# Patient Record
Sex: Male | Born: 1960 | Race: White | Hispanic: No | Marital: Single | Smoking: Former smoker
Health system: Southern US, Community
[De-identification: ages and names within clinical notes are randomized; demographics above are authoritative.]

## PROBLEM LIST (undated history)

## (undated) DIAGNOSIS — I10 Essential (primary) hypertension: Secondary | ICD-10-CM

## (undated) DIAGNOSIS — C801 Malignant (primary) neoplasm, unspecified: Secondary | ICD-10-CM

## (undated) DIAGNOSIS — M1990 Unspecified osteoarthritis, unspecified site: Secondary | ICD-10-CM

## (undated) DIAGNOSIS — K219 Gastro-esophageal reflux disease without esophagitis: Secondary | ICD-10-CM

## (undated) DIAGNOSIS — K2270 Barrett's esophagus without dysplasia: Secondary | ICD-10-CM

## (undated) DIAGNOSIS — E119 Type 2 diabetes mellitus without complications: Secondary | ICD-10-CM

## (undated) DIAGNOSIS — Z7985 Long-term (current) use of injectable non-insulin antidiabetic drugs: Secondary | ICD-10-CM

## (undated) DIAGNOSIS — IMO0001 Reserved for inherently not codable concepts without codable children: Secondary | ICD-10-CM

## (undated) DIAGNOSIS — E7800 Pure hypercholesterolemia, unspecified: Secondary | ICD-10-CM

## (undated) HISTORY — DX: Reserved for inherently not codable concepts without codable children: IMO0001

## (undated) HISTORY — DX: Essential (primary) hypertension: I10

## (undated) HISTORY — DX: Gastro-esophageal reflux disease without esophagitis: K21.9

## (undated) HISTORY — DX: Malignant (primary) neoplasm, unspecified: C80.1

---

## 1993-01-10 DIAGNOSIS — C801 Malignant (primary) neoplasm, unspecified: Secondary | ICD-10-CM

## 1993-01-10 HISTORY — DX: Malignant (primary) neoplasm, unspecified: C80.1

## 1993-01-10 HISTORY — PX: OTHER SURGICAL HISTORY: SHX169

## 2007-09-19 ENCOUNTER — Emergency Department: Payer: Self-pay | Admitting: Emergency Medicine

## 2012-06-08 ENCOUNTER — Emergency Department: Payer: Self-pay | Admitting: Emergency Medicine

## 2012-07-31 HISTORY — PX: FOOT SURGERY: SHX648

## 2012-10-11 ENCOUNTER — Encounter: Payer: Self-pay | Admitting: *Deleted

## 2012-10-12 ENCOUNTER — Encounter: Payer: Self-pay | Admitting: Podiatry

## 2012-10-12 ENCOUNTER — Ambulatory Visit (INDEPENDENT_AMBULATORY_CARE_PROVIDER_SITE_OTHER): Payer: BC Managed Care – PPO | Admitting: Podiatry

## 2012-10-12 VITALS — BP 134/75 | HR 60 | Temp 97.7°F | Resp 16 | Ht 72.0 in | Wt 252.8 lb

## 2012-10-12 DIAGNOSIS — M2040 Other hammer toe(s) (acquired), unspecified foot: Secondary | ICD-10-CM

## 2012-10-12 DIAGNOSIS — G5762 Lesion of plantar nerve, left lower limb: Secondary | ICD-10-CM

## 2012-10-12 DIAGNOSIS — G5760 Lesion of plantar nerve, unspecified lower limb: Secondary | ICD-10-CM

## 2012-10-12 NOTE — Patient Instructions (Signed)
Call if any problems

## 2012-10-12 NOTE — Progress Notes (Signed)
Subjective:     Patient ID: Lee Lucas, male   DOB: [birth year], 52 y.o.   MRN: [id number]  HPI I'm doing great. Patient states pain has completely resolved at this time.   Review of Systems     Objective:   Physical Exam neurovascular status within normal limits. Patient's incision sites are healed well with mild edema still noted in the forefoot.     Assessment:     Patient is doing well postoperatively with resolution of symptoms.    Plan:     Advised patient swelling is still normal. Reviewed x-rays with the patient. Discharged unless any problems

## 2013-08-02 ENCOUNTER — Ambulatory Visit: Payer: Self-pay | Admitting: Unknown Physician Specialty

## 2013-08-05 HISTORY — PX: COLONOSCOPY WITH ESOPHAGOGASTRODUODENOSCOPY (EGD): SHX5779

## 2013-08-06 LAB — PATHOLOGY REPORT

## 2014-03-20 ENCOUNTER — Ambulatory Visit: Payer: Self-pay | Admitting: Surgery

## 2015-01-27 ENCOUNTER — Encounter: Payer: Self-pay | Admitting: *Deleted

## 2015-01-28 ENCOUNTER — Ambulatory Visit: Payer: BLUE CROSS/BLUE SHIELD | Admitting: Anesthesiology

## 2015-01-28 ENCOUNTER — Encounter
Admission: RE | Disposition: A | Discharge status: Home / Self Care | Payer: Self-pay | Source: Ambulatory Visit | Attending: Surgery

## 2015-01-28 ENCOUNTER — Ambulatory Visit
Admission: RE | Admit: 2015-01-28 | Discharge: 2015-01-28 | Disposition: A | Discharge status: Home / Self Care | Payer: BLUE CROSS/BLUE SHIELD | Source: Ambulatory Visit | Attending: Surgery | Admitting: Surgery

## 2015-01-28 DIAGNOSIS — Z800 Family history of malignant neoplasm of digestive organs: Secondary | ICD-10-CM | POA: Diagnosis not present

## 2015-01-28 DIAGNOSIS — M19012 Primary osteoarthritis, left shoulder: Secondary | ICD-10-CM | POA: Diagnosis not present

## 2015-01-28 DIAGNOSIS — Z79899 Other long term (current) drug therapy: Secondary | ICD-10-CM | POA: Diagnosis not present

## 2015-01-28 DIAGNOSIS — Z7982 Long term (current) use of aspirin: Secondary | ICD-10-CM | POA: Diagnosis not present

## 2015-01-28 DIAGNOSIS — M7542 Impingement syndrome of left shoulder: Secondary | ICD-10-CM | POA: Diagnosis present

## 2015-01-28 DIAGNOSIS — E119 Type 2 diabetes mellitus without complications: Secondary | ICD-10-CM | POA: Insufficient documentation

## 2015-01-28 DIAGNOSIS — K219 Gastro-esophageal reflux disease without esophagitis: Secondary | ICD-10-CM | POA: Insufficient documentation

## 2015-01-28 DIAGNOSIS — Z7984 Long term (current) use of oral hypoglycemic drugs: Secondary | ICD-10-CM | POA: Insufficient documentation

## 2015-01-28 DIAGNOSIS — Z8547 Personal history of malignant neoplasm of testis: Secondary | ICD-10-CM | POA: Insufficient documentation

## 2015-01-28 DIAGNOSIS — Z87891 Personal history of nicotine dependence: Secondary | ICD-10-CM | POA: Insufficient documentation

## 2015-01-28 DIAGNOSIS — Z8249 Family history of ischemic heart disease and other diseases of the circulatory system: Secondary | ICD-10-CM | POA: Diagnosis not present

## 2015-01-28 DIAGNOSIS — M94212 Chondromalacia, left shoulder: Secondary | ICD-10-CM | POA: Insufficient documentation

## 2015-01-28 DIAGNOSIS — I10 Essential (primary) hypertension: Secondary | ICD-10-CM | POA: Insufficient documentation

## 2015-01-28 DIAGNOSIS — Z86010 Personal history of colonic polyps: Secondary | ICD-10-CM | POA: Insufficient documentation

## 2015-01-28 DIAGNOSIS — M75112 Incomplete rotator cuff tear or rupture of left shoulder, not specified as traumatic: Secondary | ICD-10-CM | POA: Diagnosis not present

## 2015-01-28 HISTORY — DX: Barrett's esophagus without dysplasia: K22.70

## 2015-01-28 HISTORY — DX: Type 2 diabetes mellitus without complications: E11.9

## 2015-01-28 HISTORY — PX: SHOULDER ARTHROSCOPY: SHX128

## 2015-01-28 HISTORY — DX: Gastro-esophageal reflux disease without esophagitis: K21.9

## 2015-01-28 HISTORY — DX: Unspecified osteoarthritis, unspecified site: M19.90

## 2015-01-28 LAB — GLUCOSE, CAPILLARY
GLUCOSE-CAPILLARY: 105 mg/dL — AB (ref 65–99)
GLUCOSE-CAPILLARY: 122 mg/dL — AB (ref 65–99)

## 2015-01-28 SURGERY — ARTHROSCOPY, SHOULDER
Anesthesia: Regional | Laterality: Left | Wound class: Clean

## 2015-01-28 MED ORDER — DEXTROSE 5 % IV SOLN
3.0000 g | Freq: Once | INTRAVENOUS | Status: AC
  Administered 2015-01-28: 3 g via INTRAVENOUS

## 2015-01-28 MED ORDER — LIDOCAINE HCL (CARDIAC) 20 MG/ML IV SOLN
INTRAVENOUS | Status: DC | PRN
  Administered 2015-01-28: 50 mg via INTRATRACHEAL

## 2015-01-28 MED ORDER — LACTATED RINGERS IV SOLN
INTRAVENOUS | Status: DC
  Administered 2015-01-28: 13:00:00 via INTRAVENOUS

## 2015-01-28 MED ORDER — FENTANYL CITRATE (PF) 100 MCG/2ML IJ SOLN: 25.0000 ug | INTRAMUSCULAR | Status: DC | PRN

## 2015-01-28 MED ORDER — ONDANSETRON HCL 4 MG/2ML IJ SOLN: 4.0000 mg | Freq: Four times a day (QID) | INTRAMUSCULAR | Status: DC | PRN

## 2015-01-28 MED ORDER — ONDANSETRON HCL 4 MG/2ML IJ SOLN
INTRAMUSCULAR | Status: DC | PRN
  Administered 2015-01-28: 4 mg via INTRAVENOUS

## 2015-01-28 MED ORDER — FENTANYL CITRATE (PF) 100 MCG/2ML IJ SOLN
INTRAMUSCULAR | Status: DC | PRN
  Administered 2015-01-28: 100 ug via INTRAVENOUS

## 2015-01-28 MED ORDER — PROPOFOL 10 MG/ML IV BOLUS
INTRAVENOUS | Status: DC | PRN
  Administered 2015-01-28: 200 mg via INTRAVENOUS

## 2015-01-28 MED ORDER — OXYCODONE HCL 5 MG PO TABS: 5.0000 mg | ORAL_TABLET | ORAL | Status: DC | PRN

## 2015-01-28 MED ORDER — ONDANSETRON HCL 4 MG PO TABS: 4.0000 mg | ORAL_TABLET | Freq: Four times a day (QID) | ORAL | Status: DC | PRN

## 2015-01-28 MED ORDER — DEXAMETHASONE SODIUM PHOSPHATE 4 MG/ML IJ SOLN
INTRAMUSCULAR | Status: DC | PRN
  Administered 2015-01-28: 8 mg via INTRAVENOUS

## 2015-01-28 MED ORDER — POTASSIUM CHLORIDE IN NACL 20-0.9 MEQ/L-% IV SOLN: INTRAVENOUS | Status: DC

## 2015-01-28 MED ORDER — METOCLOPRAMIDE HCL 5 MG/ML IJ SOLN: 5.0000 mg | Freq: Three times a day (TID) | INTRAMUSCULAR | Status: DC | PRN

## 2015-01-28 MED ORDER — LACTATED RINGERS IV SOLN
INTRAVENOUS | Status: DC | PRN
  Administered 2015-01-28 (×2): 3000 mL

## 2015-01-28 MED ORDER — METOCLOPRAMIDE HCL 5 MG PO TABS: 5.0000 mg | ORAL_TABLET | Freq: Three times a day (TID) | ORAL | Status: DC | PRN

## 2015-01-28 MED ORDER — ONDANSETRON HCL 4 MG/2ML IJ SOLN: 4.0000 mg | Freq: Once | INTRAMUSCULAR | Status: DC | PRN

## 2015-01-28 MED ORDER — MIDAZOLAM HCL 2 MG/2ML IJ SOLN
INTRAMUSCULAR | Status: DC | PRN
Start: 2015-01-28 — End: 2015-01-28
  Administered 2015-01-28: 2 mg via INTRAVENOUS

## 2015-01-28 MED ORDER — ROPIVACAINE HCL 5 MG/ML IJ SOLN
INTRAMUSCULAR | Status: DC | PRN
  Administered 2015-01-28: 35 mL via PERINEURAL

## 2015-01-28 SURGICAL SUPPLY — 35 items
ANCHOR JUGGERKNOT WTAP NDL 2.9 (Anchor) ×2 IMPLANT
BIT DRILL JUGRKNT W/NDL BIT2.9 (DRILL) ×1 IMPLANT
BLADE FULL RADIUS 3.5 (BLADE) ×2 IMPLANT
BUR ACROMIONIZER 4.0 (BURR) ×2 IMPLANT
CHLORAPREP W/TINT 26ML (MISCELLANEOUS) ×4 IMPLANT
COVER LIGHT HANDLE UNIVERSAL (MISCELLANEOUS) ×4 IMPLANT
COVER MAYO STAND STRL (DRAPES) ×2 IMPLANT
DRAPE IMP U-DRAPE 54X76 (DRAPES) ×4 IMPLANT
DRILL JUGGERKNOT W/NDL BIT 2.9 (DRILL) ×2
GAUZE PETRO XEROFOAM 1X8 (MISCELLANEOUS) ×2 IMPLANT
GAUZE SPONGE 4X4 12PLY STRL (GAUZE/BANDAGES/DRESSINGS) ×2 IMPLANT
GLOVE BIO SURGEON STRL SZ8 (GLOVE) ×4 IMPLANT
GLOVE INDICATOR 8.0 STRL GRN (GLOVE) ×2 IMPLANT
GOWN STRL REUS W/ TWL LRG LVL3 (GOWN DISPOSABLE) ×1 IMPLANT
GOWN STRL REUS W/ TWL XL LVL3 (GOWN DISPOSABLE) ×1 IMPLANT
GOWN STRL REUS W/TWL LRG LVL3 (GOWN DISPOSABLE) ×1
GOWN STRL REUS W/TWL XL LVL3 (GOWN DISPOSABLE) ×1
IV LACTATED RINGER IRRG 3000ML (IV SOLUTION) ×2
IV LR IRRIG 3000ML ARTHROMATIC (IV SOLUTION) ×2 IMPLANT
KIT CANNULA 8X76-LX IN CANNULA (CANNULA) ×2 IMPLANT
KIT ROOM TURNOVER OR (KITS) ×2 IMPLANT
MANIFOLD 4PT FOR NEPTUNE1 (MISCELLANEOUS) ×2 IMPLANT
MAT BLUE FLOOR 46X72 FLO (MISCELLANEOUS) ×2 IMPLANT
NEEDLE HYPO 21X1.5 SAFETY (NEEDLE) ×2 IMPLANT
PACK ARTHROSCOPY SHOULDER (MISCELLANEOUS) ×2 IMPLANT
PAD GROUND ADULT SPLIT (MISCELLANEOUS) ×2 IMPLANT
SLING ULTRA II LG (MISCELLANEOUS) ×2 IMPLANT
STAPLER SKIN PROX 35W (STAPLE) ×2 IMPLANT
STRAP BODY AND KNEE 60X3 (MISCELLANEOUS) ×4 IMPLANT
SUT ETHIBOND 0 MO6 C/R (SUTURE) ×2 IMPLANT
SUT VIC AB 2-0 CT1 27 (SUTURE) ×1
SUT VIC AB 2-0 CT1 TAPERPNT 27 (SUTURE) ×1 IMPLANT
TAPE MICROFOAM 4IN (TAPE) ×2 IMPLANT
TUBING ARTHRO INFLOW-ONLY STRL (TUBING) ×2 IMPLANT
WAND HAND CNTRL MULTIVAC 90 (MISCELLANEOUS) ×2 IMPLANT

## 2015-01-28 NOTE — Anesthesia Postprocedure Evaluation (Signed)
Anesthesia Post Note  Patient: Lee Lucas  Procedure(s) Performed: Procedure(s) (LRB): ARTHROSCOPY SHOULDER WITH DEBRIDEMENT DECOMPRESSION AND MANIPULATION UNDER ANESTHESIA (Left)  Patient location during evaluation: PACU Anesthesia Type: Regional and General Level of consciousness: awake and alert and oriented Pain management: satisfactory to patient Vital Signs Assessment: post-procedure vital signs reviewed and stable Respiratory status: spontaneous breathing, nonlabored ventilation and respiratory function stable Cardiovascular status: blood pressure returned to baseline and stable Postop Assessment: Adequate PO intake and No signs of nausea or vomiting Anesthetic complications: no    Raliegh Ip

## 2015-01-28 NOTE — Discharge Instructions (Signed)
General Anesthesia, Adult, Care After Refer to this sheet in the next few weeks. These instructions provide you with information on caring for yourself after your procedure. Your health care provider may also give you more specific instructions. Your treatment has been planned according to current medical practices, but problems sometimes occur. Call your health care provider if you have any problems or questions after your procedure. WHAT TO EXPECT AFTER THE PROCEDURE After the procedure, it is typical to experience:  Sleepiness.  Nausea and vomiting. HOME CARE INSTRUCTIONS  For the first 24 hours after general anesthesia:  Have a responsible person with you.  Do not drive a car. If you are alone, do not take public transportation.  Do not drink alcohol.  Do not take medicine that has not been prescribed by your health care provider.  Do not sign important papers or make important decisions.  You may resume a normal diet and activities as directed by your health care provider.  Change bandages (dressings) as directed.  If you have questions or problems that seem related to general anesthesia, call the hospital and ask for the anesthetist or anesthesiologist on call. SEEK MEDICAL CARE IF:  You have nausea and vomiting that continue the day after anesthesia.  You develop a rash. SEEK IMMEDIATE MEDICAL CARE IF:   You have difficulty breathing.  You have chest pain.  You have any allergic problems.   This information is not intended to replace advice given to you by your health care provider. Make sure you discuss any questions you have with your health care provider.   Document Released: 04/04/2000 Document Revised: 01/17/2014 Document Reviewed: 04/27/2011 Elsevier Interactive Patient Education 2016 [employer].  Keep dressing dry and intact.  May shower after dressing changed on post-op day #4 (Sunday).  Cover staples with Band-Aids after drying off. Apply ice  frequently to shoulder. Keep shoulder immobilizer on at all times for first 3-5 days except may remove for bathing purposes. May wean out of sling after 3-5 days as symptoms permit. Follow-up in 10-14 days or as scheduled.

## 2015-01-28 NOTE — Transfer of Care (Signed)
Immediate Anesthesia Transfer of Care Note  Patient: Lee Lucas  Procedure(s) Performed: Procedure(s) with comments: ARTHROSCOPY SHOULDER WITH DEBRIDEMENT DECOMPRESSION AND MANIPULATION UNDER ANESTHESIA (Left) - Diabetic - oral meds  Patient Location: PACU  Anesthesia Type: General LMA, Regional  Level of Consciousness: awake, alert  and patient cooperative  Airway and Oxygen Therapy: Patient Spontanous Breathing and Patient connected to supplemental oxygen  Post-op Assessment: Post-op Vital signs reviewed, Patient's Cardiovascular Status Stable, Respiratory Function Stable, Patent Airway and No signs of Nausea or vomiting  Post-op Vital Signs: Reviewed and stable  Complications: No apparent anesthesia complications

## 2015-01-28 NOTE — Anesthesia Procedure Notes (Addendum)
Anesthesia Regional Block:  Interscalene brachial plexus block  Pre-Anesthetic Checklist: ,, timeout performed, Correct Patient, Correct Site, Correct Laterality, Correct Procedure, Correct Position, site marked, Risks and benefits discussed,  Surgical consent,  Pre-op evaluation,  At surgeon's request and post-op pain management  Laterality: Left  Prep: chloraprep       Needles:  Injection technique: Single-shot  Needle Type: Stimiplex     Needle Length: 10cm 10 cm Needle Gauge: 21 and 21 G    Additional Needles:  Procedures: ultrasound guided (picture in chart) Interscalene brachial plexus block Narrative:  Start time: 01/28/2015 12:02 PM End time: 01/28/2015 12:07 PM Injection made incrementally with aspirations every 5 mL.  Performed by: Personally  Anesthesiologist: Ronelle Nigh  Additional Notes: Functioning IV was confirmed and monitors applied. Ultrasound guidance: relevant anatomy identified, needle position confirmed, local anesthetic spread visualized around nerve(s)., vascular puncture avoided.  Image printed for medical record.  Negative aspiration and no paresthesias; incremental administration of local anesthetic. The patient tolerated the procedure well. Vitals signes recorded in RN notes.   Procedure Name: LMA Insertion Performed by: Londell Moh Pre-anesthesia Checklist: Patient identified, Emergency Drugs available, Suction available, Timeout performed and Patient being monitored Patient Re-evaluated:Patient Re-evaluated prior to inductionOxygen Delivery Method: Circle system utilized Preoxygenation: Pre-oxygenation with 100% oxygen Intubation Type: IV induction LMA: LMA inserted LMA Size: 5.0 Number of attempts: 1 Placement Confirmation: positive ETCO2 and breath sounds checked- equal and bilateral Tube secured with: Tape

## 2015-01-28 NOTE — H&P (Signed)
Paper H&P to be scanned into permanent record. H&P reviewed. No changes. 

## 2015-01-28 NOTE — Progress Notes (Signed)
Assisted Clance Boll ANMD with left, interscalene  block. Side rails up, monitors on throughout procedure. See vital signs in flow sheet. Tolerated Procedure well.

## 2015-01-28 NOTE — Op Note (Signed)
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  Postoperative diagnosis: Impingement/tendinopathy with partial-thickness rotator cuff tear, biceps tendinopathy, and significant degenerative joint disease, left shoulder.  Procedure: Extensive arthroscopic debridement, arthroscopic subacromial decompression, and mini-open biceps tenodesis, left shoulder.  Anesthesia: General laryngeal mask anesthesia with interscalene block placed preoperatively by the anesthesiologist.  Surgeon:   Pascal Lux, MD  Assistant:   None  Findings: As above. There were extensive grade 3-4 chondromalacial changes involving both the glenoid and humeral head surfaces. There was extensive labral fraying with a substantial type I tear of the superior and postero-superior portions of the labrum without frank detachment of the labrum superiorly. There was a partial thickness longitudinal tear involving the superior insertional fibers of the subscapularis tendon, as well as an articular surface partial thickness tear of the insertional fibers of the supraspinatus tendon involving proximally 25-30% of the footprint.  Complications: None  Fluids:   700 cc  Estimated blood loss: 15 cc  Tourniquet time: None  Drains: None  Closure: Staples   Brief clinical note: The patient is a 55 year old male with a long history of gradually worsening left shoulder pain. The patient's symptoms have progressed despite medications, activity modification, etc. The patient's history and examination are consistent with impingement/tendinopathy with possible adhesive capsulitis. An MRI scan demonstrated tendinopathic changes, as well as some degenerative changes but no obvious rotator cuff tear. The patient presents at this time for definitive management of these shoulder symptoms.  Procedure: The patient  underwent placement of an interscalene block by the anesthesiologis in the preoperative holding area before he was brought back to the operating room and lain in the supine position t. The patient then underwent general laryngeal mask anesthesia before being repositioned in the beach chair position using the beach chair positioner. The left shoulder was placed through a range of motion. Passively, the patient exhibited full range of motion without any adhesions. The left shoulder and upper extremity were prepped with ChloraPrep solution before being draped sterilely. Preoperative antibiotics were administered. A timeout was performed to confirm the proper surgical site before the expected portal sites and incision site were injected with 0.5% Sensorcaine with epinephrine. A posterior portal was created and the glenohumeral joint thoroughly inspected with the findings as described above. An anterior portal was created using an outside-in technique. The labrum and rotator cuff were further probed, again confirming the above-noted findings. Extensive debridement was performed, removing significant fraying of the superior posterior labral regions with portions of the labrum extending well down into the glenohumeral joint. The areas of degenerative fraying of the articular insertional fibers of the supraspinatus and subscapularis tendons also were debrided thoroughly. Loose articular cartilage also was debrided back to stable margins using the full-radius resector. Finally, there were substantial areas of significant reactive synovitis anteriorly, superiorly, and posteriorly which were debrided with the full-radius resector. The ArthroCare wand was used to release the biceps tendon from its labral insertion. It also was used to obtain hemostasis as well as to "anneal" the labrum superiorly and anteriorly. The instruments were removed from the joint after suctioning the excess fluid.  The camera was repositioned through  the posterior portal into the subacromial space. A separate lateral portal was created using an outside-in technique. The 3.5 mm full-radius resector was introduced and used to perform a subtotal bursectomy. The ArthroCare wand was then inserted and used to remove the periosteal tissue  off the undersurface of the anterior third of the acromion as well as to recess the coracoacromial ligament from its attachment along the anterior and lateral margins of the acromion. The 4.0 mm acromionizing bur was introduced and used to complete the decompression by removing the undersurface of the anterior third of the acromion. The full radius resector was reintroduced to remove any residual bony debris before the ArthroCare wand was reintroduced to obtain hemostasis. The instruments were then removed from the subacromial space after suctioning the excess fluid.  An approximately 4-5 cm incision was made over the anterolateral aspect of the shoulder beginning at the anterolateral corner of the acromion and extending distally in line with the bicipital groove. This incision was carried down through the subcutaneous tissues to expose the deltoid fascia. The raphae between the anterior and middle thirds was identified and this plane developed to provide access into the subacromial space. Additional bursal tissues were debrided sharply using Metzenbaum scissors. The rotator cuff tear was readily identified and carefully inspected. There is no evidence for any bursal surface tearing of the rotator cuff. The bicipital groove was identified by palpation and opened for 1-1.5 cm. The biceps tendon stump was retrieved through this defect. The floor of the bicipital groove was roughened with a curet before another Biomet 2.9 mm JuggerKnot anchor was inserted. Both sets of sutures were passed through the biceps tendon and tied securely to effect the tenodesis. The bicipital sheath was reapproximated using two #0 Ethibond interrupted  sutures, incorporating the biceps tendon to further reinforce the tenodesis.  The wound was copiously irrigated with sterile saline solution before the deltoid raphae was reapproximated using 2-0 Vicryl interrupted sutures. The subcutaneous tissues were closed in two layers using 2-0 Vicryl interrupted sutures before the skin was closed using staples. The portal sites also were closed using staples. A sterile bulky dressing was applied to the shoulder before the arm was placed into a shoulder immobilizer. The patient was then awakened, extubated, and returned to the recovery room in satisfactory condition after tolerating the procedure well.

## 2015-01-28 NOTE — Anesthesia Preprocedure Evaluation (Signed)
Anesthesia Evaluation  Patient identified by MRN, date of birth, ID band  Reviewed: Allergy & Precautions, H&P , NPO status , Patient's Chart, lab work & pertinent test results  Airway Mallampati: II  TM Distance: >3 FB Neck ROM: full    Dental no notable dental hx.    Pulmonary former smoker,    Pulmonary exam normal        Cardiovascular hypertension,  Rhythm:regular Rate:Normal     Neuro/Psych    GI/Hepatic GERD  ,  Endo/Other  diabetes  Renal/GU      Musculoskeletal   Abdominal   Peds  Hematology   Anesthesia Other Findings   Reproductive/Obstetrics                             Anesthesia Physical Anesthesia Plan  ASA: II  Anesthesia Plan: General LMA and Regional   Post-op Pain Management: GA combined w/ Regional for post-op pain   Induction:   Airway Management Planned:   Additional Equipment:   Intra-op Plan:   Post-operative Plan:   Informed Consent: I have reviewed the patients History and Physical, chart, labs and discussed the procedure including the risks, benefits and alternatives for the proposed anesthesia with the patient or authorized representative who has indicated his/her understanding and acceptance.     Plan Discussed with: CRNA  Anesthesia Plan Comments:         Anesthesia Quick Evaluation

## 2015-01-29 ENCOUNTER — Encounter: Payer: Self-pay | Admitting: Surgery

## 2015-07-28 ENCOUNTER — Encounter: Payer: BLUE CROSS/BLUE SHIELD | Attending: Internal Medicine | Admitting: Dietician

## 2015-07-28 ENCOUNTER — Encounter: Payer: Self-pay | Admitting: Dietician

## 2015-07-28 VITALS — BP 124/78 | Ht 72.0 in | Wt 253.9 lb

## 2015-07-28 DIAGNOSIS — E119 Type 2 diabetes mellitus without complications: Secondary | ICD-10-CM | POA: Diagnosis present

## 2015-07-28 NOTE — Progress Notes (Signed)
Diabetes Self-Management Education  Visit Type: First/Initial  Appt. Start Time: 0900 Appt. End Time: 1000  07/28/2015  [name], identified by name and date of birth, is a 55 y.o. male with a diagnosis of Diabetes: Type 2.   ASSESSMENT  Blood pressure 124/78, height 6' (1.829 m), weight 253 lb 14.4 oz (115.168 kg). Body mass index is 34.43 kg/(m^2).  Lacks knowledge of diabetes care      Diabetes Self-Management Education - 07/28/15 1023    Visit Information   Visit Type First/Initial   Initial Visit   Diabetes Type Type 2   Health Coping   How would you rate your overall health? Fair   Psychosocial Assessment   Patient Belief/Attitude about Diabetes Motivated to manage diabetes   Self-care barriers Other (comment)  works irregular days each week   Other persons present Patient   Patient Concerns Glycemic Control;Weight Control   Special Needs None   Preferred Learning Style Auditory   Learning Readiness Ready   Pre-Education Assessment   Patient understands the diabetes disease and treatment process. Needs Instruction   Patient understands incorporating nutritional management into lifestyle. Needs Instruction   Patient undertands incorporating physical activity into lifestyle. Needs Instruction   Patient understands using medications safely. Needs Instruction   Patient understands monitoring blood glucose, interpreting and using results Needs Instruction   Patient understands prevention, detection, and treatment of acute complications. Needs Instruction   Patient understands prevention, detection, and treatment of chronic complications. Needs Instruction   Patient understands how to develop strategies to address psychosocial issues. Needs Instruction   Patient understands how to develop strategies to promote health/change behavior. Needs Instruction   Complications   Last HgB A1C per patient/outside source 7.8 %  06-16-15   How often do you check your blood  sugar? --  FBG 3x/wk.-pt using his mom's BG meter   Fasting Blood glucose range (mg/dL) 130-179;180-200   Have you had a dilated eye exam in the past 12 months? Yes  10-2014   Have you had a dental exam in the past 12 months? Yes  1 month ago   Are you checking your feet? No   Dietary Intake   Breakfast --  eats 3 meals/day and 3 snacks-eats breakfast at 5:30-6am   Snack (morning) --  eats crackers, pnuts, or snack foods/sweets at [place] --  eats lunch at 12p-usually sandwich   Snack (afternoon) --  eats crackers, pnuts or sweets/snack foods at 2p   Dinner --  eats supper at 4p-eats fried foods daily and sweets 5-6x/wk.   Snack (evening) --  eats crackers, pnuts or brownies    Beverage(s) --  drinks water 6-7x/day-regular soda, sweet and gatorade 6-7x/day-1 diet soda/day-0-1 fruit juice/day and milk 1-2x/day   Exercise   Exercise Type ADL's   Patient Education   Previous Diabetes Education No   Disease state  Definition of diabetes, type 1 and 2, and the diagnosis of diabetes;Explored patient's options for treatment of their diabetes;Factors that contribute to the development of diabetes   Nutrition management  Role of diet in the treatment of diabetes and the relationship between the three main macronutrients and blood glucose level;Food label reading, portion sizes and measuring food.;Carbohydrate counting   Physical activity and exercise  Role of exercise on diabetes management, blood pressure control and cardiac health.;Helped patient identify appropriate exercises in relation to his/her diabetes, diabetes complications and other health issue.   Medications Reviewed patients medication for diabetes, action, purpose,  timing of dose and side effects.   Monitoring Taught/evaluated SMBG meter.;Purpose and frequency of SMBG.;Taught/discussed recording of test results and interpretation of SMBG.;Identified appropriate SMBG and/or A1C goals.  gave pt Contour Next One meter and  instructed on its use-BG 155 2 hr pp   Chronic complications Relationship between chronic complications and blood glucose control;Dental care;Retinopathy and reason for yearly dilated eye exams   Personal strategies to promote health Lifestyle issues that need to be addressed for better diabetes care;Helped patient develop diabetes management plan for (enter comment)   Outcomes   Expected Outcomes Demonstrated interest in learning. Expect positive outcomes      Individualized Plan for Diabetes Self-Management Training:   Learning Objective:  Patient will have a greater understanding of diabetes self-management. Patient education plan is to attend individual and/or group sessions per assessed needs and concerns.   Plan:   Check BG's 2x/day FBG and 2 hr pp BG and record  Walk 10-15 min 3x/wk.  Avoid sweetened beverages and fruit juices  Eat 3 meals/day and 2 snacks in am and at bedtime  Eat 3-4 carbohydrate serving/s/meal + protein  Include 1 carbohydrate serving/snack + protein  Space meals 4-6 hours apart  Limit intake of fried foods and sweets  Make healthy food choices  Call MD for prescription for Contour Next test strips and Microlet Lancets-check BG's 2x/day  Get a sharps container  Bring BG log to next appointment   Expected Outcomes:  Demonstrated interest in learning. Expect positive outcomes  Education material provided: General Meal Planning Guidelines, Contour Next One meter  If problems or questions, patient to contact team via:  [phone]  Future DSME appointment:  08-13-15 class 1 (will need to schedule class 2 and class 3 later due to work schedule)

## 2015-07-28 NOTE — Patient Instructions (Signed)
  Check blood sugars 2 x day before breakfast and 2 hrs after supper every day Exercise:  walk for  10-15  minutes  3  days a week Avoid sugar sweetened drinks (soda, tea, coffee, sports drinks, juices) Limit intake of fried foods and sweets/desserts Eat 3 meals day,  2  snacks a day in am and at bedtime Eat 3-4 carbohydrate servings/meal + protein Eat 1 carbohydrate serving/snack + protein Make healthy food choices Space meals 4-6 hours apart Bring blood sugar records to the next appointment/class Call your doctor for a prescription for:  1. Meter strips (type)  Contour Next strips  checking2 times per day  2. Lancets (type) Microlet lancets  checking 2   times per day Get a Sharps container Return for appointment/classes on: 08-13-15 for class 1  (need to schedule classes 2  and 3 later due to his work)

## 2015-08-13 ENCOUNTER — Ambulatory Visit: Payer: BLUE CROSS/BLUE SHIELD

## 2015-08-13 ENCOUNTER — Telehealth: Payer: Self-pay | Admitting: Dietician

## 2015-08-14 NOTE — Telephone Encounter (Signed)
Patient called and cancelled Class 1 of the Diabetes Program. I called and spoke with his mother who stated he can not reschedule at this time due to his work schedule.

## 2015-08-24 ENCOUNTER — Encounter: Payer: Self-pay | Admitting: Dietician

## 2017-02-06 DIAGNOSIS — E7800 Pure hypercholesterolemia, unspecified: Secondary | ICD-10-CM | POA: Insufficient documentation

## 2018-08-10 ENCOUNTER — Ambulatory Visit
Admission: RE | Admit: 2018-08-10 | Discharge: 2018-08-10 | Disposition: A | Discharge status: Home / Self Care | Payer: Disability Insurance | Source: Ambulatory Visit | Attending: Internal Medicine | Admitting: Internal Medicine

## 2018-08-10 ENCOUNTER — Other Ambulatory Visit: Payer: Self-pay | Admitting: Internal Medicine

## 2018-08-10 DIAGNOSIS — M25512 Pain in left shoulder: Secondary | ICD-10-CM | POA: Insufficient documentation

## 2018-08-10 DIAGNOSIS — M25511 Pain in right shoulder: Secondary | ICD-10-CM

## 2019-09-12 ENCOUNTER — Other Ambulatory Visit: Payer: Self-pay

## 2019-09-12 ENCOUNTER — Ambulatory Visit: Payer: BC Managed Care – PPO | Admitting: Cardiology

## 2019-09-12 ENCOUNTER — Encounter: Payer: Self-pay | Admitting: Cardiology

## 2019-09-12 VITALS — BP 116/68 | HR 61 | Ht 71.0 in | Wt 251.2 lb

## 2019-09-12 DIAGNOSIS — E7800 Pure hypercholesterolemia, unspecified: Secondary | ICD-10-CM | POA: Diagnosis not present

## 2019-09-12 DIAGNOSIS — I10 Essential (primary) hypertension: Secondary | ICD-10-CM

## 2019-09-12 DIAGNOSIS — R011 Cardiac murmur, unspecified: Secondary | ICD-10-CM | POA: Diagnosis not present

## 2019-09-12 NOTE — Patient Instructions (Signed)

## 2019-09-12 NOTE — Progress Notes (Signed)
Cardiology Office Note:    Date:  09/12/2019   ID:  Lee Lucas, DOB 08/17/1960, MRN [id number]  PCP:  Bunnie Pion, [facility] Cardiologist:  Kate Sable, MD  [place] Electrophysiologist:  None   Referring MD: Bunnie Pion, FNP   Chief Complaint  Patient presents with  . other    Referred by [facility] for new onset murmur   Lee Lucas is a 59 y.o. male who is being seen today for the evaluation of cardiac murmur at the request of Bunnie Pion, FNP.   History of Present Illness:    Lee Lucas is a 59 y.o. male with a hx of hypertension, diabetes, hyperlipidemia, prior smoker x20 years who presents due to cardiac murmur.  Patient saw her primary care provider for regular visit where cardiac murmur was noted on exam.  He denies any history of heart disease.  He takes Lasix for lower extremity edema.  His edema usually occurs when he has been on his feet for too long, towards the end of the day. denies chest pain or shortness of breath at rest or with exertion.  He otherwise feels well from a cardiac perspective.  Has left shoulder arthritis, has some stiffness secondary to recent surgery.  Past Medical History:  Diagnosis Date  . Arthritis    left shoulder, left knee  . Barrett's esophagus   . 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 Diabetes mellitus without complication ([place])   . GERD (gastroesophageal reflux disease)   . HBP (high blood pressure)   . Reflux     Past Surgical History:  Procedure Laterality Date  . COLONOSCOPY WITH ESOPHAGOGASTRODUODENOSCOPY (EGD)  08/05/13  . FOOT SURGERY Left 7.22.14  . SHOULDER ARTHROSCOPY Left 01/28/2015   Procedure: ARTHROSCOPY SHOULDER WITH DEBRIDEMENT DECOMPRESSION AND MANIPULATION UNDER ANESTHESIA;  Surgeon: Corky Mull, MD;  Location: [place];  Service: Orthopedics;  Laterality: Left;  Diabetic - oral meds  . 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 aspirin 81 MG tablet Take 81 mg by mouth daily.  Marland Kitchen atorvastatin (LIPITOR) 40 MG tablet Take 40 mg by mouth at bedtime.  . furosemide (LASIX) 40 MG tablet Take 40 mg by mouth daily. 1-2 daily  . lisinopril (ZESTRIL) 10 MG tablet Take 10 mg by mouth daily.  . metFORMIN (GLUCOPHAGE) 1000 MG tablet Take 1,000 mg by mouth 2 (two) times daily with a meal.  . pioglitazone (ACTOS) 30 MG tablet Take 30 mg by mouth daily.   . Vitamin D, Ergocalciferol, (DRISDOL) 1.25 MG (50000 UNIT) CAPS capsule Take 50,000 Units by mouth once a week.     Allergies:   Patient has no known allergies.   Social History   Socioeconomic History  . Marital status: Single    Spouse name: Not on file  . Number of children: Not on file  . Years of education: Not on file  . Highest education level: Not on file  Occupational History  . Not on file  Tobacco Use  . Smoking status: Former Smoker    Packs/day: 1.50    Years: 18.00    Pack years: 27.00    Types: Cigars, Cigarettes    Quit date: 02/12/2000    Years since quitting: 19.5  . Smokeless tobacco: Never Used  Substance and Sexual Activity  . Alcohol use: Yes  Alcohol/week: 0.0 - 1.0 standard drinks    Comment: DRINK 2-3 BEERS EACH WEEK  . Drug use: No  . Sexual activity: Not on file  Other Topics Concern  . Not on file  Social History Narrative  . Not on file   Social Determinants of Health   Financial Resource Strain:   . Difficulty of Paying Living Expenses: Not on file  Food Insecurity:   . Worried About Charity fundraiser in the Last Year: Not on file  . Ran Out of Food in the Last Year: Not on file  Transportation Needs:   . Lack of Transportation (Medical): Not on file  . Lack of Transportation (Non-Medical): Not on file  Physical Activity:   . Days of Exercise per Week: Not on file  . Minutes of Exercise per Session: Not on file  Stress:   . Feeling of Stress : Not on file   Social Connections:   . Frequency of Communication with Friends and Family: Not on file  . Frequency of Social Gatherings with Friends and Family: Not on file  . Attends Religious Services: Not on file  . Active Member of Clubs or Organizations: Not on file  . Attends Archivist Meetings: Not on file  . Marital Status: Not on file     Family History: The patient's family history includes Heart murmur in his mother.  ROS:   Please see the history of present illness.     All other systems reviewed and are negative.  EKGs/Labs/Other Studies Reviewed:    The following studies were reviewed today:   EKG:  EKG is  ordered today.  The ekg ordered today demonstrates normal sinus rhythm, normal ECG.  Recent Labs: No results found for requested labs within last 8760 hours.  Recent Lipid Panel No results found for: CHOL, TRIG, HDL, CHOLHDL, VLDL, LDLCALC, LDLDIRECT  Physical Exam:    VS:  BP 116/68   Pulse 61   Ht 5\' 11"  (1.803 m)   Wt 251 lb 3.2 oz (113.9 kg)   SpO2 97%   BMI 35.04 kg/m     Wt Readings from Last 3 Encounters:  09/12/19 251 lb 3.2 oz (113.9 kg)  07/28/15 253 lb 14.4 oz (115.2 kg)  01/28/15 264 lb (119.7 kg)     GEN:  Well nourished, well developed in no acute distress HEENT: Normal NECK: No JVD; No carotid bruits LYMPHATICS: No lymphadenopathy CARDIAC: RRR, 1/6 systolic murmur right upper sternal border. RESPIRATORY:  Clear to auscultation without rales, wheezing or rhonchi  ABDOMEN: Soft, non-tender, distended MUSCULOSKELETAL:  trace edema; No deformity  SKIN: Warm and dry NEUROLOGIC:  Alert and oriented x 3 PSYCHIATRIC:  Normal affect   ASSESSMENT:    1. Systolic murmur   2. Essential hypertension   3. Pure hypercholesterolemia    PLAN:    In order of problems listed above:  1. Patient with systolic murmur noted on cardiac exam.  Denies chest pain or shortness of breath.  Trace edema noted on exam.  Get echocardiogram to evaluate  any significant valvular pathology or structural cardiac defects. 2. History of hypertension, BP controlled.  Continue lisinopril. 3. History of hyperlipidemia, on statin.  Follow-up after echocardiogram.   Medication Adjustments/Labs and Tests Ordered: Current medicines are reviewed at length with the patient today.  Concerns regarding medicines are outlined above.  Orders Placed This Encounter  Procedures  . EKG 12-Lead  . ECHOCARDIOGRAM COMPLETE   No orders of the defined types  were placed in this encounter.   Patient Instructions  Medication Instructions:  Your physician recommends that you continue on your current medications as directed. Please refer to the Current Medication list given to you today.  *If you need a refill on your cardiac medications before your next appointment, please call your pharmacy*   Lab Work: None Ordered If you have labs (blood work) drawn today and your tests are completely normal, you will receive your results only by: Marland Kitchen MyChart Message (if you have MyChart) OR . A paper copy in the mail If you have any lab test that is abnormal or we need to change your treatment, we will call you to review the results.   Testing/Procedures:  1. Your physician has requested that you have an echocardiogram. Echocardiography is a painless test that uses sound waves to create images of your heart. It provides your doctor with information about the size and shape of your heart and how well your heart's chambers and valves are working. This procedure takes approximately one hour. There are no restrictions for this procedure.    Follow-Up: At [facility], you and your health needs are our priority.  As part of our continuing mission to provide you with exceptional heart care, we have created designated Provider Care Teams.  These Care Teams include your primary Cardiologist (physician) and Advanced Practice Providers (APPs -  Physician Assistants and Nurse  Practitioners) who all work together to provide you with the care you need, when you need it.  We recommend signing up for the patient portal called "MyChart".  Sign up information is provided on this After Visit Summary.  MyChart is used to connect with patients for Virtual Visits (Telemedicine).  Patients are able to view lab/test results, encounter notes, upcoming appointments, etc.  Non-urgent messages can be sent to your provider as well.   To learn more about what you can do with MyChart, go to [email or web address].    Your next appointment:   Follow up after Echo   The format for your next appointment:   In Person  Provider:   Kate Sable, MD   Other Instructions      Signed, Kate Sable, MD  09/12/2019 12:42 PM    [place]

## 2019-10-11 ENCOUNTER — Ambulatory Visit (INDEPENDENT_AMBULATORY_CARE_PROVIDER_SITE_OTHER): Payer: BC Managed Care – PPO

## 2019-10-11 ENCOUNTER — Other Ambulatory Visit: Payer: Self-pay

## 2019-10-11 DIAGNOSIS — R011 Cardiac murmur, unspecified: Secondary | ICD-10-CM

## 2019-10-11 LAB — ECHOCARDIOGRAM COMPLETE
AR max vel: 3.12 cm2
AV Area VTI: 3.55 cm2
AV Area mean vel: 3.55 cm2
AV Mean grad: 6 mmHg
AV Peak grad: 12.4 mmHg
Ao pk vel: 1.76 m/s
Area-P 1/2: 4.44 cm2
Calc EF: 53.9 %
S' Lateral: 3.9 cm
Single Plane A2C EF: 55.9 %
Single Plane A4C EF: 50.6 %

## 2019-10-18 ENCOUNTER — Ambulatory Visit: Payer: Disability Insurance | Admitting: Cardiology

## 2020-04-21 ENCOUNTER — Other Ambulatory Visit: Payer: Self-pay | Admitting: Orthopedic Surgery

## 2020-04-21 ENCOUNTER — Ambulatory Visit
Admission: RE | Admit: 2020-04-21 | Discharge: 2020-04-21 | Disposition: A | Discharge status: Home / Self Care | Payer: Disability Insurance | Source: Ambulatory Visit | Attending: Orthopedic Surgery | Admitting: Orthopedic Surgery

## 2020-04-21 ENCOUNTER — Telehealth: Payer: Self-pay | Admitting: Orthopedic Surgery

## 2020-04-21 DIAGNOSIS — S4292XA Fracture of left shoulder girdle, part unspecified, initial encounter for closed fracture: Secondary | ICD-10-CM

## 2020-07-17 ENCOUNTER — Other Ambulatory Visit: Payer: Self-pay | Admitting: Student

## 2020-07-17 ENCOUNTER — Other Ambulatory Visit (HOSPITAL_COMMUNITY): Payer: Self-pay | Admitting: Student

## 2020-07-17 DIAGNOSIS — M19012 Primary osteoarthritis, left shoulder: Secondary | ICD-10-CM

## 2020-07-17 DIAGNOSIS — M7582 Other shoulder lesions, left shoulder: Secondary | ICD-10-CM

## 2020-07-29 ENCOUNTER — Ambulatory Visit
Admission: RE | Admit: 2020-07-29 | Discharge: 2020-07-29 | Disposition: A | Discharge status: Home / Self Care | Payer: BC Managed Care – PPO | Source: Ambulatory Visit | Attending: Student | Admitting: Student

## 2020-07-29 ENCOUNTER — Other Ambulatory Visit: Payer: Self-pay

## 2020-07-29 DIAGNOSIS — M19012 Primary osteoarthritis, left shoulder: Secondary | ICD-10-CM | POA: Diagnosis present

## 2020-07-29 DIAGNOSIS — M7582 Other shoulder lesions, left shoulder: Secondary | ICD-10-CM | POA: Diagnosis not present

## 2020-08-14 ENCOUNTER — Other Ambulatory Visit: Payer: Self-pay | Admitting: Surgery

## 2020-08-19 ENCOUNTER — Inpatient Hospital Stay: Admission: RE | Admit: 2020-08-19 | Payer: Disability Insurance | Source: Ambulatory Visit

## 2020-08-19 ENCOUNTER — Other Ambulatory Visit: Payer: Self-pay

## 2020-08-19 ENCOUNTER — Encounter
Admission: RE | Admit: 2020-08-19 | Discharge: 2020-08-19 | Disposition: A | Discharge status: Home / Self Care | Payer: Disability Insurance | Source: Ambulatory Visit | Attending: Surgery | Admitting: Surgery

## 2020-08-19 DIAGNOSIS — Z01818 Encounter for other preprocedural examination: Secondary | ICD-10-CM | POA: Diagnosis present

## 2020-08-19 LAB — URINALYSIS, ROUTINE W REFLEX MICROSCOPIC
Bilirubin Urine: NEGATIVE
Glucose, UA: NEGATIVE mg/dL
Hgb urine dipstick: NEGATIVE
Ketones, ur: NEGATIVE mg/dL
Leukocytes,Ua: NEGATIVE
Nitrite: NEGATIVE
Protein, ur: NEGATIVE mg/dL
Specific Gravity, Urine: 1.008 (ref 1.005–1.030)
pH: 6 (ref 5.0–8.0)

## 2020-08-19 LAB — COMPREHENSIVE METABOLIC PANEL
ALT: 19 U/L (ref 0–44)
AST: 19 U/L (ref 15–41)
Albumin: 3.4 g/dL — ABNORMAL LOW (ref 3.5–5.0)
Alkaline Phosphatase: 73 U/L (ref 38–126)
Anion gap: 8 (ref 5–15)
BUN: 12 mg/dL (ref 6–20)
CO2: 30 mmol/L (ref 22–32)
Calcium: 8.8 mg/dL — ABNORMAL LOW (ref 8.9–10.3)
Chloride: 104 mmol/L (ref 98–111)
Creatinine, Ser: 0.76 mg/dL (ref 0.61–1.24)
GFR, Estimated: >60 mL/min (ref >60)
Glucose, Bld: 126 mg/dL — ABNORMAL HIGH (ref 70–99)
Potassium: 3.4 mmol/L — ABNORMAL LOW (ref 3.5–5.1)
Sodium: 142 mmol/L (ref 135–145)
Total Bilirubin: 0.6 mg/dL (ref 0.3–1.2)
Total Protein: 6.1 g/dL — ABNORMAL LOW (ref 6.5–8.1)

## 2020-08-19 LAB — CBC WITH DIFFERENTIAL/PLATELET
Abs Immature Granulocytes: 0.02 10*3/uL (ref 0.00–0.07)
Basophils Absolute: 0 10*3/uL (ref 0.0–0.1)
Basophils Relative: 0 %
Eosinophils Absolute: 0.2 10*3/uL (ref 0.0–0.5)
Eosinophils Relative: 3 %
HCT: 39.3 % (ref 39.0–52.0)
Hemoglobin: 13.4 g/dL (ref 13.0–17.0)
Immature Granulocytes: 0 %
Lymphocytes Relative: 21 %
Lymphs Abs: 1.5 10*3/uL (ref 0.7–4.0)
MCH: 32.4 pg (ref 26.0–34.0)
MCHC: 34.1 g/dL (ref 30.0–36.0)
MCV: 95.2 fL (ref 80.0–100.0)
Monocytes Absolute: 0.6 10*3/uL (ref 0.1–1.0)
Monocytes Relative: 9 %
Neutro Abs: 4.8 10*3/uL (ref 1.7–7.7)
Neutrophils Relative %: 67 %
Platelets: 214 10*3/uL (ref 150–400)
RBC: 4.13 MIL/uL — ABNORMAL LOW (ref 4.22–5.81)
RDW: 12.7 % (ref 11.5–15.5)
WBC: 7.2 10*3/uL (ref 4.0–10.5)
nRBC: 0 % (ref 0.0–0.2)

## 2020-08-19 LAB — TYPE AND SCREEN
ABO/RH(D): A NEG
Antibody Screen: NEGATIVE

## 2020-08-19 LAB — SURGICAL PCR SCREEN
MRSA, PCR: NEGATIVE
Staphylococcus aureus: POSITIVE — AB

## 2020-08-19 NOTE — Patient Instructions (Addendum)
Your procedure is scheduled on: 08/27/20 Report to [place]. To find out your arrival time please call [phone] between 1PM - 3PM on 08/26/20.  Remember: Instructions that are not followed completely may result in serious medical risk, up to and including death, or upon the discretion of your surgeon and anesthesiologist your surgery may need to be rescheduled.     _X__ 1. Do not eat food after midnight the night before your procedure.                 No gum chewing or hard candies.                  Diabetics water only UP TO 2 HOURS PRIOR TO ARRIVAL  __X__2.  On the morning of surgery brush your teeth with toothpaste and water, you                 may rinse your mouth with mouthwash if you wish.  Do not swallow any              toothpaste of mouthwash.     _X__ 3.  No Alcohol for 24 hours before or after surgery.   _X__ 4.  Do Not Smoke or use e-cigarettes For 24 Hours Prior to Your Surgery.                 Do not use any chewable tobacco products for at least 6 hours prior to                 surgery.  ____  5.  Bring all medications with you on the day of surgery if instructed.   __X__  6.  Notify your doctor if there is any change in your medical condition      (cold, fever, infections).     Do not wear jewelry, make-up, hairpins, clips or nail polish. Do not wear lotions, powders, or perfumes. NO DEODORANT Do not shave BODY HAIR 48 hours prior to surgery. Men may shave face and neck. Do not bring valuables to the hospital.    [facility] is not responsible for any belongings or valuables.  Contacts, dentures/partials or body piercings may not be worn into surgery. Bring a case for your contacts, glasses or hearing aids, a denture cup will be supplied. Leave your suitcase in the car. After surgery it may be brought to your room. For patients admitted to the hospital, discharge time is determined by your treatment  team.   Patients discharged the day of surgery will not be allowed to drive home.   Please read over the following fact sheets that you were given:   MRSA Information.CHG SOAP, iNCENTIVE SPIROMETER, BENZOYL PEROXIDE  __X__ Take these medicines the morning of surgery with A SIP OF WATER:    1. none  2.   3.   4.  5.  6.  ____ Fleet Enema (as directed)   __X__ Use CHG Soap/SAGE wipes as directed  ____ Use inhalers on the day of surgery  __X__ Stop metformin/Janumet/Farxiga 2 days prior to surgery    ____ Take 1/2 of usual insulin dose the night before surgery. No insulin the morning          of surgery.   ____ Stop Blood Thinners Coumadin/Plavix/Xarelto/Pleta/Pradaxa/Eliquis/Effient/Aspirin  on   Or contact your Surgeon, Cardiologist or Medical Doctor regarding  ability to stop your blood thinners  __X__ Stop Anti-inflammatories 7  days before surgery such as Advil, Ibuprofen, Motrin,  BC or Goodies Powder, Naprosyn, Naproxen, Aleve, Aspirin    __X__ Stop all herbal supplements, fish oil or vitamin E until after surgery.    ____ Bring C-Pap to the hospital.    STOP ASPIRIN THURS 08/20/20 TYLENOL IS OK

## 2020-08-25 ENCOUNTER — Other Ambulatory Visit
Admission: RE | Admit: 2020-08-25 | Discharge: 2020-08-25 | Disposition: A | Discharge status: Home / Self Care | Payer: BC Managed Care – PPO | Source: Ambulatory Visit | Attending: Surgery | Admitting: Surgery

## 2020-08-25 ENCOUNTER — Other Ambulatory Visit: Payer: Self-pay

## 2020-08-25 DIAGNOSIS — Z20822 Contact with and (suspected) exposure to covid-19: Secondary | ICD-10-CM | POA: Insufficient documentation

## 2020-08-25 DIAGNOSIS — Z01812 Encounter for preprocedural laboratory examination: Secondary | ICD-10-CM | POA: Insufficient documentation

## 2020-08-25 LAB — SARS CORONAVIRUS 2 (TAT 6-24 HRS): SARS Coronavirus 2: NEGATIVE

## 2020-08-26 MED ORDER — CEFAZOLIN SODIUM-DEXTROSE 2-4 GM/100ML-% IV SOLN
2.0000 g | INTRAVENOUS | Status: AC
  Administered 2020-08-27: 2 g via INTRAVENOUS

## 2020-08-27 ENCOUNTER — Other Ambulatory Visit: Payer: Self-pay

## 2020-08-27 ENCOUNTER — Encounter: Payer: Self-pay | Admitting: Surgery

## 2020-08-27 ENCOUNTER — Inpatient Hospital Stay: Payer: BC Managed Care – PPO

## 2020-08-27 ENCOUNTER — Inpatient Hospital Stay
Admission: RE | Admit: 2020-08-27 | Discharge: 2020-08-28 | DRG: 483 | Disposition: A | Discharge status: Home / Self Care | Payer: BC Managed Care – PPO | Attending: Surgery | Admitting: Surgery

## 2020-08-27 ENCOUNTER — Inpatient Hospital Stay: Payer: BC Managed Care – PPO | Admitting: Urgent Care

## 2020-08-27 ENCOUNTER — Encounter
Admission: RE | Disposition: A | Discharge status: Home / Self Care | Payer: Self-pay | Source: Home / Self Care | Attending: Surgery

## 2020-08-27 DIAGNOSIS — Z7984 Long term (current) use of oral hypoglycemic drugs: Secondary | ICD-10-CM

## 2020-08-27 DIAGNOSIS — Z20822 Contact with and (suspected) exposure to covid-19: Secondary | ICD-10-CM | POA: Diagnosis present

## 2020-08-27 DIAGNOSIS — K219 Gastro-esophageal reflux disease without esophagitis: Secondary | ICD-10-CM | POA: Diagnosis present

## 2020-08-27 DIAGNOSIS — M25512 Pain in left shoulder: Secondary | ICD-10-CM

## 2020-08-27 DIAGNOSIS — M7582 Other shoulder lesions, left shoulder: Secondary | ICD-10-CM | POA: Diagnosis present

## 2020-08-27 DIAGNOSIS — E119 Type 2 diabetes mellitus without complications: Secondary | ICD-10-CM | POA: Diagnosis present

## 2020-08-27 DIAGNOSIS — Z6835 Body mass index (BMI) 35.0-35.9, adult: Secondary | ICD-10-CM

## 2020-08-27 DIAGNOSIS — Z800 Family history of malignant neoplasm of digestive organs: Secondary | ICD-10-CM | POA: Diagnosis not present

## 2020-08-27 DIAGNOSIS — Z87891 Personal history of nicotine dependence: Secondary | ICD-10-CM

## 2020-08-27 DIAGNOSIS — G8929 Other chronic pain: Secondary | ICD-10-CM | POA: Diagnosis present

## 2020-08-27 DIAGNOSIS — Z96612 Presence of left artificial shoulder joint: Secondary | ICD-10-CM

## 2020-08-27 DIAGNOSIS — M19012 Primary osteoarthritis, left shoulder: Secondary | ICD-10-CM | POA: Diagnosis present

## 2020-08-27 DIAGNOSIS — Z8249 Family history of ischemic heart disease and other diseases of the circulatory system: Secondary | ICD-10-CM | POA: Diagnosis not present

## 2020-08-27 DIAGNOSIS — I10 Essential (primary) hypertension: Secondary | ICD-10-CM | POA: Diagnosis present

## 2020-08-27 DIAGNOSIS — Z9079 Acquired absence of other genital organ(s): Secondary | ICD-10-CM | POA: Diagnosis not present

## 2020-08-27 DIAGNOSIS — Z79899 Other long term (current) drug therapy: Secondary | ICD-10-CM

## 2020-08-27 DIAGNOSIS — Z8547 Personal history of malignant neoplasm of testis: Secondary | ICD-10-CM | POA: Diagnosis not present

## 2020-08-27 HISTORY — PX: BICEPT TENODESIS: SHX5116

## 2020-08-27 HISTORY — PX: REVERSE SHOULDER ARTHROPLASTY: SHX5054

## 2020-08-27 LAB — GLUCOSE, CAPILLARY
Glucose-Capillary: 111 mg/dL — ABNORMAL HIGH (ref 70–99)
Glucose-Capillary: 157 mg/dL — ABNORMAL HIGH (ref 70–99)
Glucose-Capillary: 179 mg/dL — ABNORMAL HIGH (ref 70–99)
Glucose-Capillary: 142 mg/dL — ABNORMAL HIGH (ref 70–99)

## 2020-08-27 LAB — CREATININE, SERUM
Creatinine, Ser: 0.67 mg/dL (ref 0.61–1.24)
GFR, Estimated: >60 mL/min (ref >60)

## 2020-08-27 LAB — ABO/RH: ABO/RH(D): A NEG

## 2020-08-27 SURGERY — ARTHROPLASTY, SHOULDER, TOTAL, REVERSE
Anesthesia: General | Site: Shoulder | Laterality: Left

## 2020-08-27 MED ORDER — SUGAMMADEX SODIUM 200 MG/2ML IV SOLN
INTRAVENOUS | Status: DC | PRN
  Administered 2020-08-27: 300 mg via INTRAVENOUS

## 2020-08-27 MED ORDER — LISINOPRIL 10 MG PO TABS
10.0000 mg | ORAL_TABLET | Freq: Every day | ORAL | Status: DC
  Administered 2020-08-28: 10 mg via ORAL
  Filled 2020-08-27: qty 1

## 2020-08-27 MED ORDER — BUPIVACAINE LIPOSOME 1.3 % IJ SUSP
INTRAMUSCULAR | Status: AC
  Filled 2020-08-27: qty 20

## 2020-08-27 MED ORDER — SODIUM CHLORIDE 0.9 % IV SOLN: INTRAVENOUS | Status: DC

## 2020-08-27 MED ORDER — DEXMEDETOMIDINE (PRECEDEX) IN NS 20 MCG/5ML (4 MCG/ML) IV SYRINGE
PREFILLED_SYRINGE | INTRAVENOUS | Status: DC | PRN
  Administered 2020-08-27 (×5): 4 ug via INTRAVENOUS

## 2020-08-27 MED ORDER — GLYCOPYRROLATE 0.2 MG/ML IJ SOLN
INTRAMUSCULAR | Status: AC
  Filled 2020-08-27: qty 1

## 2020-08-27 MED ORDER — TRAMADOL HCL 50 MG PO TABS: 50.0000 mg | ORAL_TABLET | Freq: Four times a day (QID) | ORAL | Status: DC | PRN

## 2020-08-27 MED ORDER — BUPIVACAINE HCL (PF) 0.5 % IJ SOLN
INTRAMUSCULAR | Status: AC
  Filled 2020-08-27: qty 10

## 2020-08-27 MED ORDER — TRANEXAMIC ACID 1000 MG/10ML IV SOLN
INTRAVENOUS | Status: AC
  Filled 2020-08-27: qty 10

## 2020-08-27 MED ORDER — CEFAZOLIN SODIUM-DEXTROSE 2-4 GM/100ML-% IV SOLN
2.0000 g | Freq: Four times a day (QID) | INTRAVENOUS | Status: AC
  Administered 2020-08-27 – 2020-08-28 (×3): 2 g via INTRAVENOUS
  Filled 2020-08-27 (×3): qty 100

## 2020-08-27 MED ORDER — PIOGLITAZONE HCL 30 MG PO TABS
30.0000 mg | ORAL_TABLET | Freq: Every day | ORAL | Status: DC
  Administered 2020-08-28: 30 mg via ORAL
  Filled 2020-08-27: qty 1

## 2020-08-27 MED ORDER — GLYCOPYRROLATE 0.2 MG/ML IJ SOLN
INTRAMUSCULAR | Status: DC | PRN
  Administered 2020-08-27: .2 mg via INTRAVENOUS

## 2020-08-27 MED ORDER — MIDAZOLAM HCL 2 MG/2ML IJ SOLN
INTRAMUSCULAR | Status: AC
  Administered 2020-08-27: 1 mg via INTRAVENOUS
  Filled 2020-08-27: qty 2

## 2020-08-27 MED ORDER — FENTANYL CITRATE (PF) 100 MCG/2ML IJ SOLN: 25.0000 ug | INTRAMUSCULAR | Status: DC | PRN

## 2020-08-27 MED ORDER — DIPHENHYDRAMINE HCL 12.5 MG/5ML PO ELIX: 12.5000 mg | ORAL_SOLUTION | ORAL | Status: DC | PRN

## 2020-08-27 MED ORDER — HYDROMORPHONE HCL 1 MG/ML IJ SOLN: 0.5000 mg | INTRAMUSCULAR | Status: DC | PRN

## 2020-08-27 MED ORDER — MIDAZOLAM HCL 2 MG/2ML IJ SOLN
INTRAMUSCULAR | Status: AC
  Filled 2020-08-27: qty 2

## 2020-08-27 MED ORDER — FENTANYL CITRATE (PF) 100 MCG/2ML IJ SOLN
INTRAMUSCULAR | Status: AC
  Administered 2020-08-27: 50 ug via INTRAVENOUS
  Filled 2020-08-27: qty 2

## 2020-08-27 MED ORDER — ORAL CARE MOUTH RINSE: 15.0000 mL | Freq: Once | OROMUCOSAL | Status: AC

## 2020-08-27 MED ORDER — METOCLOPRAMIDE HCL 5 MG/ML IJ SOLN: 5.0000 mg | Freq: Three times a day (TID) | INTRAMUSCULAR | Status: DC | PRN

## 2020-08-27 MED ORDER — MIDAZOLAM HCL 2 MG/2ML IJ SOLN: 1.0000 mg | Freq: Once | INTRAMUSCULAR | Status: AC

## 2020-08-27 MED ORDER — FUROSEMIDE 40 MG PO TABS
40.0000 mg | ORAL_TABLET | Freq: Every day | ORAL | Status: DC
  Administered 2020-08-28: 40 mg via ORAL
  Filled 2020-08-27: qty 1

## 2020-08-27 MED ORDER — FAMOTIDINE 20 MG PO TABS
ORAL_TABLET | ORAL | Status: AC
  Administered 2020-08-27: 20 mg via ORAL
  Filled 2020-08-27: qty 1

## 2020-08-27 MED ORDER — FLEET ENEMA 7-19 GM/118ML RE ENEM: 1.0000 | ENEMA | Freq: Once | RECTAL | Status: DC | PRN

## 2020-08-27 MED ORDER — LIDOCAINE HCL (CARDIAC) PF 100 MG/5ML IV SOSY
PREFILLED_SYRINGE | INTRAVENOUS | Status: DC | PRN
  Administered 2020-08-27: 80 mg via INTRAVENOUS

## 2020-08-27 MED ORDER — EPHEDRINE SULFATE 50 MG/ML IJ SOLN
INTRAMUSCULAR | Status: DC | PRN
  Administered 2020-08-27 (×2): 5 mg via INTRAVENOUS

## 2020-08-27 MED ORDER — DOCUSATE SODIUM 100 MG PO CAPS
100.0000 mg | ORAL_CAPSULE | Freq: Two times a day (BID) | ORAL | Status: DC
  Administered 2020-08-27 – 2020-08-28 (×2): 100 mg via ORAL
  Filled 2020-08-27 (×2): qty 1

## 2020-08-27 MED ORDER — SODIUM CHLORIDE FLUSH 0.9 % IV SOLN
INTRAVENOUS | Status: AC
  Filled 2020-08-27: qty 40

## 2020-08-27 MED ORDER — OXYCODONE HCL 5 MG/5ML PO SOLN: 5.0000 mg | Freq: Once | ORAL | Status: DC | PRN

## 2020-08-27 MED ORDER — TRANEXAMIC ACID 1000 MG/10ML IV SOLN
INTRAVENOUS | Status: DC | PRN
  Administered 2020-08-27: 1000 mg via INTRAVENOUS

## 2020-08-27 MED ORDER — FENTANYL CITRATE (PF) 100 MCG/2ML IJ SOLN
INTRAMUSCULAR | Status: DC | PRN
  Administered 2020-08-27 (×2): 50 ug via INTRAVENOUS

## 2020-08-27 MED ORDER — KETOROLAC TROMETHAMINE 30 MG/ML IJ SOLN
INTRAMUSCULAR | Status: AC
  Administered 2020-08-27: 30 mg via INTRAVENOUS
  Filled 2020-08-27: qty 1

## 2020-08-27 MED ORDER — DEXAMETHASONE SODIUM PHOSPHATE 10 MG/ML IJ SOLN
INTRAMUSCULAR | Status: DC | PRN
  Administered 2020-08-27: 5 mg via INTRAVENOUS

## 2020-08-27 MED ORDER — ACETAMINOPHEN 10 MG/ML IV SOLN
INTRAVENOUS | Status: DC | PRN
  Administered 2020-08-27: 1000 mg via INTRAVENOUS

## 2020-08-27 MED ORDER — ROCURONIUM BROMIDE 100 MG/10ML IV SOLN
INTRAVENOUS | Status: DC | PRN
  Administered 2020-08-27: 50 mg via INTRAVENOUS
  Administered 2020-08-27: 10 mg via INTRAVENOUS
  Administered 2020-08-27: 30 mg via INTRAVENOUS
  Administered 2020-08-27: 20 mg via INTRAVENOUS
  Administered 2020-08-27: 30 mg via INTRAVENOUS
  Administered 2020-08-27: 20 mg via INTRAVENOUS

## 2020-08-27 MED ORDER — VITAMIN D (ERGOCALCIFEROL) 1.25 MG (50000 UNIT) PO CAPS: 50000.0000 [IU] | ORAL_CAPSULE | ORAL | Status: DC

## 2020-08-27 MED ORDER — METOCLOPRAMIDE HCL 10 MG PO TABS: 5.0000 mg | ORAL_TABLET | Freq: Three times a day (TID) | ORAL | Status: DC | PRN

## 2020-08-27 MED ORDER — SODIUM CHLORIDE 0.9 % IR SOLN
Status: DC | PRN
  Administered 2020-08-27: 3000 mL

## 2020-08-27 MED ORDER — BUPIVACAINE-EPINEPHRINE (PF) 0.5% -1:200000 IJ SOLN
INTRAMUSCULAR | Status: DC | PRN
  Administered 2020-08-27: 30 mL via PERINEURAL

## 2020-08-27 MED ORDER — ONDANSETRON HCL 4 MG/2ML IJ SOLN
INTRAMUSCULAR | Status: AC
  Filled 2020-08-27: qty 2

## 2020-08-27 MED ORDER — SUGAMMADEX SODIUM 500 MG/5ML IV SOLN
INTRAVENOUS | Status: AC
  Filled 2020-08-27: qty 5

## 2020-08-27 MED ORDER — CHLORHEXIDINE GLUCONATE 0.12 % MT SOLN: 15.0000 mL | Freq: Once | OROMUCOSAL | Status: AC

## 2020-08-27 MED ORDER — ROCURONIUM BROMIDE 10 MG/ML (PF) SYRINGE
PREFILLED_SYRINGE | INTRAVENOUS | Status: AC
  Filled 2020-08-27: qty 20

## 2020-08-27 MED ORDER — DEXMEDETOMIDINE (PRECEDEX) IN NS 20 MCG/5ML (4 MCG/ML) IV SYRINGE
PREFILLED_SYRINGE | INTRAVENOUS | Status: AC
  Filled 2020-08-27: qty 5

## 2020-08-27 MED ORDER — BISACODYL 10 MG RE SUPP: 10.0000 mg | Freq: Every day | RECTAL | Status: DC | PRN

## 2020-08-27 MED ORDER — INSULIN ASPART 100 UNIT/ML IJ SOLN
0.0000 [IU] | Freq: Three times a day (TID) | INTRAMUSCULAR | Status: DC
  Administered 2020-08-27: 3 [IU] via SUBCUTANEOUS
  Administered 2020-08-28: 2 [IU] via SUBCUTANEOUS
  Filled 2020-08-27 (×2): qty 1

## 2020-08-27 MED ORDER — BUPIVACAINE LIPOSOME 1.3 % IJ SUSP
INTRAMUSCULAR | Status: DC | PRN
  Administered 2020-08-27: 7 mL via PERINEURAL
  Administered 2020-08-27: 13 mL via PERINEURAL

## 2020-08-27 MED ORDER — CHLORHEXIDINE GLUCONATE 0.12 % MT SOLN
OROMUCOSAL | Status: AC
  Administered 2020-08-27: 15 mL via OROMUCOSAL
  Filled 2020-08-27: qty 15

## 2020-08-27 MED ORDER — OXYCODONE HCL 5 MG PO TABS: 5.0000 mg | ORAL_TABLET | Freq: Once | ORAL | Status: DC | PRN

## 2020-08-27 MED ORDER — ATORVASTATIN CALCIUM 20 MG PO TABS
40.0000 mg | ORAL_TABLET | Freq: Every day | ORAL | Status: DC
  Administered 2020-08-27: 40 mg via ORAL
  Filled 2020-08-27: qty 2

## 2020-08-27 MED ORDER — ONDANSETRON HCL 4 MG/2ML IJ SOLN
INTRAMUSCULAR | Status: DC | PRN
  Administered 2020-08-27: 4 mg via INTRAVENOUS

## 2020-08-27 MED ORDER — BUPIVACAINE-EPINEPHRINE (PF) 0.5% -1:200000 IJ SOLN
INTRAMUSCULAR | Status: AC
  Filled 2020-08-27: qty 30

## 2020-08-27 MED ORDER — LACTATED RINGERS IV SOLN: INTRAVENOUS | Status: DC | PRN

## 2020-08-27 MED ORDER — OXYCODONE HCL 5 MG PO TABS
5.0000 mg | ORAL_TABLET | ORAL | Status: DC | PRN
  Administered 2020-08-27 – 2020-08-28 (×2): 10 mg via ORAL
  Filled 2020-08-27 (×2): qty 2

## 2020-08-27 MED ORDER — SODIUM CHLORIDE 0.9 % IV SOLN
INTRAVENOUS | Status: DC | PRN
  Administered 2020-08-27: 30 ug/min via INTRAVENOUS

## 2020-08-27 MED ORDER — ONDANSETRON HCL 4 MG/2ML IJ SOLN: 4.0000 mg | Freq: Four times a day (QID) | INTRAMUSCULAR | Status: DC | PRN

## 2020-08-27 MED ORDER — FAMOTIDINE 20 MG PO TABS: 20.0000 mg | ORAL_TABLET | Freq: Once | ORAL | Status: AC

## 2020-08-27 MED ORDER — PROPOFOL 10 MG/ML IV BOLUS
INTRAVENOUS | Status: DC | PRN
  Administered 2020-08-27: 150 mg via INTRAVENOUS

## 2020-08-27 MED ORDER — EPHEDRINE 5 MG/ML INJ
INTRAVENOUS | Status: AC
  Filled 2020-08-27: qty 5

## 2020-08-27 MED ORDER — KETOROLAC TROMETHAMINE 15 MG/ML IJ SOLN
15.0000 mg | Freq: Four times a day (QID) | INTRAMUSCULAR | Status: AC
  Administered 2020-08-27 – 2020-08-28 (×4): 15 mg via INTRAVENOUS
  Filled 2020-08-27 (×4): qty 1

## 2020-08-27 MED ORDER — KETOROLAC TROMETHAMINE 30 MG/ML IJ SOLN: 30.0000 mg | Freq: Once | INTRAMUSCULAR | Status: AC

## 2020-08-27 MED ORDER — ACETAMINOPHEN 500 MG PO TABS
1000.0000 mg | ORAL_TABLET | Freq: Four times a day (QID) | ORAL | Status: DC
  Administered 2020-08-27 – 2020-08-28 (×3): 1000 mg via ORAL
  Filled 2020-08-27 (×4): qty 2

## 2020-08-27 MED ORDER — DEXAMETHASONE SODIUM PHOSPHATE 10 MG/ML IJ SOLN
INTRAMUSCULAR | Status: AC
  Filled 2020-08-27: qty 1

## 2020-08-27 MED ORDER — ACETAMINOPHEN 10 MG/ML IV SOLN
INTRAVENOUS | Status: AC
  Filled 2020-08-27: qty 100

## 2020-08-27 MED ORDER — MAGNESIUM HYDROXIDE 400 MG/5ML PO SUSP: 30.0000 mL | Freq: Every day | ORAL | Status: DC | PRN

## 2020-08-27 MED ORDER — BUPIVACAINE HCL (PF) 0.5 % IJ SOLN
INTRAMUSCULAR | Status: DC | PRN
  Administered 2020-08-27: 7 mL via PERINEURAL
  Administered 2020-08-27: 3 mL via PERINEURAL

## 2020-08-27 MED ORDER — CEFAZOLIN SODIUM-DEXTROSE 2-4 GM/100ML-% IV SOLN
INTRAVENOUS | Status: AC
  Filled 2020-08-27: qty 100

## 2020-08-27 MED ORDER — FENTANYL CITRATE (PF) 100 MCG/2ML IJ SOLN: 50.0000 ug | Freq: Once | INTRAMUSCULAR | Status: AC

## 2020-08-27 MED ORDER — ONDANSETRON HCL 4 MG PO TABS: 4.0000 mg | ORAL_TABLET | Freq: Four times a day (QID) | ORAL | Status: DC | PRN

## 2020-08-27 MED ORDER — METFORMIN HCL 500 MG PO TABS
1000.0000 mg | ORAL_TABLET | Freq: Two times a day (BID) | ORAL | Status: DC
  Administered 2020-08-27 – 2020-08-28 (×2): 1000 mg via ORAL
  Filled 2020-08-27 (×2): qty 2

## 2020-08-27 MED ORDER — 0.9 % SODIUM CHLORIDE (POUR BTL) OPTIME
TOPICAL | Status: DC | PRN
  Administered 2020-08-27: 500 mL

## 2020-08-27 MED ORDER — LIDOCAINE HCL (PF) 1 % IJ SOLN
INTRAMUSCULAR | Status: AC
  Filled 2020-08-27: qty 5

## 2020-08-27 MED ORDER — MIDAZOLAM HCL 2 MG/2ML IJ SOLN
INTRAMUSCULAR | Status: DC | PRN
  Administered 2020-08-27: 1 mg via INTRAVENOUS

## 2020-08-27 MED ORDER — ENOXAPARIN SODIUM 40 MG/0.4ML IJ SOSY
40.0000 mg | PREFILLED_SYRINGE | INTRAMUSCULAR | Status: DC
  Administered 2020-08-28: 40 mg via SUBCUTANEOUS
  Filled 2020-08-27: qty 0.4

## 2020-08-27 MED ORDER — ACETAMINOPHEN 325 MG PO TABS: 325.0000 mg | ORAL_TABLET | Freq: Four times a day (QID) | ORAL | Status: DC | PRN

## 2020-08-27 MED ORDER — FENTANYL CITRATE (PF) 100 MCG/2ML IJ SOLN
INTRAMUSCULAR | Status: AC
  Filled 2020-08-27: qty 2

## 2020-08-27 SURGICAL SUPPLY — 73 items
BAG DECANTER FOR FLEXI CONT (MISCELLANEOUS) IMPLANT
BASEPLATE GLENOSPHERE 25 (Plate) ×3 IMPLANT
BEARING HUMERAL +3 40D (Joint) ×3 IMPLANT
BIT DRILL TWIST 2.7 (BIT) ×3 IMPLANT
BLADE SAGITTAL WIDE XTHICK NO (BLADE) ×3 IMPLANT
BOWL CEMENT MIX W/ADAPTER (MISCELLANEOUS) IMPLANT
CANISTER SUCT 1200ML W/VALVE (MISCELLANEOUS) IMPLANT
CHLORAPREP W/TINT 26 (MISCELLANEOUS) ×6 IMPLANT
COOLER POLAR GLACIER W/PUMP (MISCELLANEOUS) ×3 IMPLANT
COVER BACK TABLE REUSABLE LG (DRAPES) ×3 IMPLANT
DIAL VERSA SHOULDER 40 STD (Joint) ×3 IMPLANT
DRAPE 3/4 80X56 (DRAPES) ×6 IMPLANT
DRAPE IMP U-DRAPE 54X76 (DRAPES) ×6 IMPLANT
DRAPE INCISE IOBAN 66X45 STRL (DRAPES) ×6 IMPLANT
DRSG OPSITE POSTOP 4X8 (GAUZE/BANDAGES/DRESSINGS) ×3 IMPLANT
ELECT BLADE 6.5 EXT (BLADE) ×3 IMPLANT
ELECT CAUTERY BLADE 6.4 (BLADE) ×3 IMPLANT
ELECT REM PT RETURN 9FT ADLT (ELECTROSURGICAL) ×3
ELECTRODE REM PT RTRN 9FT ADLT (ELECTROSURGICAL) ×2 IMPLANT
GAUZE PACK 2X3YD (PACKING) IMPLANT
GAUZE XEROFORM 1X8 LF (GAUZE/BANDAGES/DRESSINGS) ×3 IMPLANT
GLOVE SRG 8 PF TXTR STRL LF DI (GLOVE) ×2 IMPLANT
GLOVE SURG ENC MOIS LTX SZ7.5 (GLOVE) ×18 IMPLANT
GLOVE SURG ENC MOIS LTX SZ8 (GLOVE) ×18 IMPLANT
GLOVE SURG PR MICRO ENCORE 7 (GLOVE) ×3 IMPLANT
GLOVE SURG UNDER LTX SZ8 (GLOVE) ×3 IMPLANT
GLOVE SURG UNDER POLY LF SZ8 (GLOVE) ×1
GOWN STRL REUS W/ TWL LRG LVL3 (GOWN DISPOSABLE) ×8 IMPLANT
GOWN STRL REUS W/ TWL XL LVL3 (GOWN DISPOSABLE) ×2 IMPLANT
GOWN STRL REUS W/TWL LRG LVL3 (GOWN DISPOSABLE) ×4
GOWN STRL REUS W/TWL XL LVL3 (GOWN DISPOSABLE) ×1
HOOD PEEL AWAY FLYTE STAYCOOL (MISCELLANEOUS) ×15 IMPLANT
ILLUMINATOR WAVEGUIDE N/F (MISCELLANEOUS) IMPLANT
IV NS 100ML SINGLE PACK (IV SOLUTION) IMPLANT
IV NS IRRIG 3000ML ARTHROMATIC (IV SOLUTION) ×3 IMPLANT
KIT STABILIZATION SHOULDER (MISCELLANEOUS) ×3 IMPLANT
MANIFOLD NEPTUNE II (INSTRUMENTS) ×3 IMPLANT
MASK FACE SPIDER DISP (MASK) ×3 IMPLANT
MAT ABSORB  FLUID 56X50 GRAY (MISCELLANEOUS) ×1
MAT ABSORB FLUID 56X50 GRAY (MISCELLANEOUS) ×2 IMPLANT
NDL SAFETY ECLIPSE 18X1.5 (NEEDLE) ×2 IMPLANT
NEEDLE HYPO 18GX1.5 SHARP (NEEDLE) ×1
NEEDLE SPNL 20GX3.5 QUINCKE YW (NEEDLE) ×3 IMPLANT
NS IRRIG 500ML POUR BTL (IV SOLUTION) ×3 IMPLANT
PACK ARTHROSCOPY SHOULDER (MISCELLANEOUS) ×3 IMPLANT
PAD WRAPON POLAR SHDR UNIV (MISCELLANEOUS) ×2 IMPLANT
PENCIL SMOKE EVACUATOR (MISCELLANEOUS) ×3 IMPLANT
PIN THREADED REVERSE (PIN) ×3 IMPLANT
PULSAVAC PLUS IRRIG FAN TIP (DISPOSABLE) ×3
SCREW BONE CORT 6.5X35MM (Screw) ×3 IMPLANT
SCREW BONE LOCKING 4.75X30X3.5 (Screw) ×3 IMPLANT
SCREW BONE LOCKING 4.75X35X3.5 (Screw) ×3 IMPLANT
SCREW NON-LOCK 4.75MMX25MMX3.5 (Screw) ×3 IMPLANT
SCREW NON-LOCK 4.75X20X3.5 (Screw) ×3 IMPLANT
SLING ULTRA II M (MISCELLANEOUS) ×3 IMPLANT
SPONGE T-LAP 18X18 ~~LOC~~+RFID (SPONGE) ×9 IMPLANT
STAPLER SKIN PROX 35W (STAPLE) ×3 IMPLANT
STEM HUMERAL STRL 18MMX55MM (Stem) ×3 IMPLANT
STRAP SAFETY 5IN WIDE (MISCELLANEOUS) ×3 IMPLANT
SUT ETHIBOND 0 MO6 C/R (SUTURE) ×3 IMPLANT
SUT FIBERWIRE #2 38 BLUE 1/2 (SUTURE) ×6
SUT VIC AB 0 CT1 36 (SUTURE) ×6 IMPLANT
SUT VIC AB 2-0 CT1 27 (SUTURE) ×2
SUT VIC AB 2-0 CT1 TAPERPNT 27 (SUTURE) ×4 IMPLANT
SUTURE FIBERWR #2 38 BLUE 1/2 (SUTURE) ×4 IMPLANT
SYR 10ML LL (SYRINGE) ×6 IMPLANT
SYR 30ML LL (SYRINGE) ×6 IMPLANT
SYR TOOMEY IRRIG 70ML (MISCELLANEOUS) ×3
SYRINGE TOOMEY IRRIG 70ML (MISCELLANEOUS) ×2 IMPLANT
TAPE TRANSPORE STRL 2 31045 (GAUZE/BANDAGES/DRESSINGS) ×3 IMPLANT
TIP FAN IRRIG PULSAVAC PLUS (DISPOSABLE) ×2 IMPLANT
TRAY HUM MINI SHOULDER +0 40D (Shoulder) ×3 IMPLANT
WRAPON POLAR PAD SHDR UNIV (MISCELLANEOUS) ×3

## 2020-08-27 NOTE — H&P (Signed)
History of Present Illness:  Lee Lucas is a 60 y.o. male who has been referred back to me by Cameron Proud, PA-C, for worsening left shoulder pain. The patient is now 5+ years status post an extensive arthroscopic debridement, arthroscopic subacromial decompression, and mini open biceps tenodesis of the left shoulder for degenerative joint disease. The patient was last seen for the symptoms in April, 2017. At that time, the patient was doing reasonably well and was allowed to return to work. The patient followed up with Cameron Proud, PA-C, last month complaining of a 2-year history of increasing left shoulder pain. X-rays at this time demonstrated advanced degenerative joint disease of the left shoulder. The patient was sent for an MRI scan and referred to me for further evaluation and treatment. His symptoms are aggravated by any activities reaching away from his body or trying to reach above shoulder level. He also is unable to reach behind his back. He has pain with attempting to sleep. He has been taking Aleve, Voltaren, Tylenol, and applying Biofreeze with limited benefit. He also has been using ice or ice alternating heat, also without benefit. He denies any reinjury since his surgery 5+ years ago and denies any numbness or paresthesias down his arm to his hand.  Current Outpatient Medications:  acetaminophen (TYLENOL) 500 mg capsule Take 500 mg by mouth as needed. 2 tablets as needed   aspirin 81 MG EC tablet Take 81 mg by mouth once daily.   atorvastatin (LIPITOR) 40 MG tablet Take by mouth Take 40 mg by mouth at bedtime.   ergocalciferol, vitamin D2, 1,250 mcg (50,000 unit) capsule Take 50,000 Units by mouth every 7 (seven) days   FUROsemide (LASIX) 40 MG tablet Take by mouth Take 40 mg by mouth daily   hydroCHLOROthiazide (HYDRODIURIL) 12.5 MG tablet TAKE 1 TABLET BY MOUTH ONCE DAILY 90 tablet 0   lisinopriL (ZESTRIL) 10 MG tablet Take 10 mg by mouth once daily   metFORMIN (GLUCOPHAGE) 1000  MG tablet Take 1 tablet by mouth twice a day with meals 180 tablet 1   naproxen sodium (ALEVE) 220 MG tablet Take 440 mg by mouth as needed for Pain   ONETOUCH ULTRA2 kit Use as instructed. 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 Roland Rack)   [employer] Left 2018   Right testicle removed   TONSILLECTOMY   Family History:   No Known Problems Mother   Heart failure Father   Colon cancer Father   Social History:   Socioeconomic History:   Marital status: Single  Tobacco Use   Smoking status: Former Smoker  Packs/day: 1.50  Years: 18.00  Pack years: 27.00  Types: Cigarettes  Quit date: 02/12/2000  Years since quitting: 20.5   Smokeless tobacco: Never Used   Tobacco comment:  Quit 2000  Vaping Use   Vaping Use: Never used  Substance and Sexual Activity   Alcohol use: Not Currently  Alcohol/week: 0.0 standard drinks  Comment: Socially   Drug use: Defer   Sexual activity: Defer   Review of Systems:  A comprehensive 14 point ROS was performed, reviewed, and the pertinent orthopaedic findings are documented in the HPI.  Physical Exam: Vitals:  08/10/20 1433  BP: 118/72  Weight: (!) 116.7 kg (257 lb 3.2 oz)  Height: 182.9 cm (6')  PainSc:  10-Worst pain ever  PainLoc: Shoulder   General/Constitutional: Pleasant overweight middle-aged male in no acute distress. Neuro/Psych: Normal mood and affect, oriented to person, place and time. Eyes: Non-icteric. Pupils are equal, round, and reactive to light, and exhibit synchronous movement. ENT: Unremarkable. Lymphatic: No palpable adenopathy. Respiratory: Lungs clear to auscultation, Normal chest excursion, No wheezes and Non-labored breathing Cardiovascular: Regular rate and rhythm. No murmurs. and No edema, swelling or tenderness, except as noted in detailed exam. Integumentary: No impressive skin lesions present, except as noted in detailed exam. Musculoskeletal: Unremarkable, except as noted in detailed exam.  Left shoulder exam: Skin inspection of the left shoulder is notable for well-healed arthroscopic portal sites and surgical incision, but otherwise is unremarkable. No swelling, erythema, ecchymosis, abrasions, or other skin abnormalities are identified. He has mild to moderate tenderness to palpation of the anterior and lateral aspects of the shoulder. Actively, he is able to forward flex to 45 degrees, abduct to 30 degrees, and internally rotate to his left iliac crest. Passively, he can tolerate forward flexion to 80 degrees and abduction to 60 degrees. He has moderate pain with all motions. He exhibits 4/5 strength with resisted internal and external rotation, as well as with gentle resisted abduction with his arm at his side. He is neurovascularly intact to the left upper extremity and hand.  X-rays/MRI/Lab data:  A recent MRI scan of the left shoulder is available for review and has been reviewed by myself. By report, the study demonstrates evidence of advanced degenerative changes of the glenohumeral joint with complete loss of the joint space and significant osteophyte formation. The rotator cuff appears to be intact, according to the radiologist. No lytic lesions or acute  bony processes are identified. Both the films and report were reviewed by myself and discussed with the patient.  Assessment:  Primary osteoarthritis of left shoulder.   Rotator cuff tendinitis, left.   Plan: The treatment options were discussed with the patient. In addition, patient educational materials were provided regarding the diagnosis and treatment options. The patient is quite frustrated by his symptoms and functional limitations, and is ready to consider more aggressive treatment options. Therefore, I have recommended a surgical procedure, specifically a standard versus reverse total shoulder arthroplasty. Based on his MRI scan study, he should be able to undergo a standard shoulder replacement, but the reverse system will be available as a backup. The procedure was discussed with the patient, as were the potential risks (including bleeding, infection, nerve and/or blood vessel injury, persistent or recurrent pain, loosening and/or failure of the components, dislocation, need for further surgery, blood clots, strokes, heart attacks and/or arhythmias, pneumonia, etc.) and benefits. The patient states his/her understanding and wishes to proceed. All of the patient's questions and concerns were answered. He can call any time with further concerns. He will follow up post-surgery, routine.    H&P reviewed and patient re-examined. No changes.

## 2020-08-27 NOTE — Evaluation (Signed)
Physical Therapy Evaluation Patient Details Name: Lee Lucas MRN: [id number] DOB: [birth year] Today's Date: 08/27/2020   History of Present Illness  Pt admitted for L reverse shoulder with biceps tendoesis. History includes cancer, GERD, and HTN.  Clinical Impression  Pt is a pleasant 60 year old male who was admitted for L reverse shoulder arthroplasty with biceps tenodesis. Pt performs bed mobility/transfers with min assist and ambulation with supervision. Pt demonstrates deficits with strength/mobility. Will need to progress ambulation distance prior to safe dc home. During skin assessment, polar care without barrier and straps donned incorrectly. Applied surgical towel to shoulder prior to polar care donned. MD notified. Would benefit from skilled PT to address above deficits and promote optimal return to PLOF. Recommend transition to [place] upon discharge from acute hospitalization.     Follow Up Recommendations Home health PT;Supervision for mobility/OOB    Equipment Recommendations  3in1 (PT)    Recommendations for Other Services       Precautions / Restrictions Precautions Precautions: Shoulder Precaution Booklet Issued: No Required Braces or Orthoses:  (shoulder abduction brace) Restrictions Weight Bearing Restrictions: Yes LUE Weight Bearing: Non weight bearing      Mobility  Bed Mobility Overal bed mobility: Needs Assistance Bed Mobility: Supine to Sit     Supine to sit: Min assist     General bed mobility comments: cues for technique. min assist for trunk. Sit at EOB with upright posture    Transfers Overall transfer level: Needs assistance Equipment used: None Transfers: Sit to/from Stand Sit to Stand: Min assist         General transfer comment: from low surface. Once standing, upright posture noted  Ambulation/Gait Ambulation/Gait assistance: Supervision Gait Distance (Feet): 5 Feet Assistive device: None Gait Pattern/deviations: Step-to  pattern     General Gait Details: safe technique. Cues for precautions  Stairs            Wheelchair Mobility    Modified Rankin (Stroke Patients Only)       Balance Overall balance assessment: Needs assistance Sitting-balance support: Feet supported Sitting balance-Leahy Scale: Good     Standing balance support: No upper extremity supported Standing balance-Leahy Scale: Fair                               Pertinent Vitals/Pain Pain Assessment: No/denies pain    Home Living Family/patient expects to be discharged to:: Private residence Living Arrangements: Parent Available Help at Discharge: Family;Neighbor;Available PRN/intermittently Type of Home: House Home Access: Ramped entrance     Home Layout: One level Home Equipment: None Additional Comments: 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 Reports no falls, indep prior     Hand Dominance        Extremity/Trunk Assessment   Upper Extremity Assessment Upper Extremity Assessment:  (NT due to numbness)    Lower Extremity Assessment Lower Extremity Assessment: Overall WFL for tasks assessed       Communication   Communication: No difficulties  Cognition Arousal/Alertness: Awake/alert Behavior During Therapy: WFL for tasks assessed/performed Overall Cognitive Status: Within Functional Limits for tasks assessed                                        General  Comments      Exercises     Assessment/Plan    PT Assessment Patient needs continued PT services  PT Problem List Decreased strength;Decreased balance;Decreased mobility;Pain       PT Treatment Interventions DME instruction;Gait training;Stair training;Therapeutic exercise    PT Goals (Current goals can be found in the Care Plan section)  Acute Rehab PT Goals Patient Stated Goal: to go home PT Goal Formulation:  With patient Time For Goal Achievement: 09/10/20 Potential to Achieve Goals: Good    Frequency BID   Barriers to discharge        Co-evaluation               AM-PAC PT "6 Clicks" Mobility  Outcome Measure Help needed turning from your back to your side while in a flat bed without using bedrails?: A Little Help needed moving from lying on your back to sitting on the side of a flat bed without using bedrails?: A Little Help needed moving to and from a bed to a chair (including a wheelchair)?: A Little Help needed standing up from a chair using your arms (e.g., wheelchair or bedside chair)?: A Little Help needed to walk in hospital room?: A Little Help needed climbing 3-5 steps with a railing? : A Little 6 Click Score: 18    End of Session Equipment Utilized During Treatment:  (shoulder brace) Activity Tolerance: Patient tolerated treatment well Patient left: in chair;with chair alarm set;with nursing/sitter in room;with SCD's reapplied Nurse Communication: Mobility status PT Visit Diagnosis: Unsteadiness on feet (R26.81);Muscle weakness (generalized) (M62.81);Pain;Difficulty in walking, not elsewhere classified (R26.2) Pain - Right/Left: Left Pain - part of body: Shoulder    Time: [id number] PT Time Calculation (min) (ACUTE ONLY): 12 min   Charges:   PT Evaluation $PT Eval Low Complexity: 1 Low          Greggory Stallion, PT, DPT [phone]   Joee Iovine 08/27/2020, 4:59 PM

## 2020-08-27 NOTE — Anesthesia Procedure Notes (Signed)
Anesthesia Regional Block: Interscalene brachial plexus block   Pre-Anesthetic Checklist: , timeout performed,  Correct Patient, Correct Site, Correct Laterality,  Correct Procedure, Correct Position, site marked,  Risks and benefits discussed,  Surgical consent,  Pre-op evaluation,  At surgeon's request and post-op pain management  Laterality: Upper and Left  Prep: chloraprep       Needles:  Injection technique: Single-shot  Needle Type: Stimiplex     Needle Length: 5cm  Needle Gauge: 22     Additional Needles:   Procedures:,,,, ultrasound used (permanent image in chart),,    Narrative:  Start time: 08/27/2020 9:09 AM End time: 08/27/2020 9:12 AM Injection made incrementally with aspirations every 5 mL.  Performed by: Personally  Anesthesiologist: Naythan Douthit, Precious Haws, MD  Additional Notes: Patient consented for risk and benefits of nerve block including but not limited to nerve damage, failed block, bleeding and infection.  Patient voiced understanding.  Functioning IV was confirmed and monitors were applied.  Timeout done prior to procedure and prior to any sedation being given to the patient.  Patient confirmed procedure site prior to any sedation given to the patient.  A 23m 22ga Stimuplex needle was used. Sterile prep,hand hygiene and sterile gloves were used.  Minimal sedation used for procedure.  No paresthesia endorsed by patient during the procedure.  Negative aspiration and negative test dose prior to incremental administration of local anesthetic. The patient tolerated the procedure well with no immediate complications.

## 2020-08-27 NOTE — Transfer of Care (Signed)
Immediate Anesthesia Transfer of Care Note  Patient: Lee Lucas  Procedure(s) Performed: REVERSE SHOULDER ARTHROPLASTY (Left: Shoulder) BICEPS TENODESIS  Patient Location: PACU  Anesthesia Type:General and Regional  Level of Consciousness: drowsy and patient cooperative  Airway & Oxygen Therapy: Patient Spontanous Breathing and Patient connected to face mask oxygen  Post-op Assessment: Report given to RN and Post -op Vital signs reviewed and stable  Post vital signs: Reviewed and stable  Last Vitals:  Vitals Value Taken Time  BP 130/58 08/27/20 1421  Temp    Pulse 65 08/27/20 1425  Resp 18 08/27/20 1425  SpO2 98 % 08/27/20 1425  Vitals shown include unvalidated device data.  Last Pain:  Vitals:   08/27/20 0832  TempSrc: Oral  PainSc: 10-Worst pain ever         Complications: No notable events documented.

## 2020-08-27 NOTE — Anesthesia Preprocedure Evaluation (Signed)
Anesthesia Evaluation  Patient identified by MRN, date of birth, ID band Patient awake    Reviewed: Allergy & Precautions, NPO status , Patient's Chart, lab work & pertinent test results  History of Anesthesia Complications Negative for: history of anesthetic complications  Airway Mallampati: III  TM Distance: <3 FB Neck ROM: full    Dental  (+) Chipped, Upper Dentures, Poor Dentition, Missing   Pulmonary neg shortness of breath, former smoker,    Pulmonary exam normal        Cardiovascular Exercise Tolerance: Good hypertension, (-) angina(-) Past MI and (-) DOE Normal cardiovascular exam     Neuro/Psych negative neurological ROS  negative psych ROS   GI/Hepatic Neg liver ROS, GERD  Medicated and Controlled,  Endo/Other  diabetes, Type 2  Renal/GU      Musculoskeletal  (+) Arthritis ,   Abdominal   Peds  Hematology negative hematology ROS (+)   Anesthesia Other Findings Past Medical History: No date: Arthritis     Comment:  left shoulder, left knee No date: Barrett's esophagus 01/10/1993: 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 No date: Diabetes mellitus without complication (HCC) No date: GERD (gastroesophageal reflux disease) No date: HBP (high blood pressure) No date: Reflux  Past Surgical History: 08/05/13: COLONOSCOPY WITH ESOPHAGOGASTRODUODENOSCOPY (EGD) 7.22.14: FOOT SURGERY; Left 01/28/2015: SHOULDER ARTHROSCOPY; Left     Comment:  Procedure: ARTHROSCOPY SHOULDER WITH DEBRIDEMENT               DECOMPRESSION AND MANIPULATION UNDER ANESTHESIA;                Surgeon: Corky Mull, MD;  Location: [facility];  Service: Orthopedics;  Laterality: Left;  Diabetic              - 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; Right     Comment:  AND LYMPH NODES  BMI    Body Mass Index: 35.58 kg/m      Reproductive/Obstetrics negative OB ROS                              Anesthesia Physical Anesthesia Plan  ASA: 3  Anesthesia Plan: General ETT   Post-op Pain Management:  Regional for Post-op pain   Induction: Intravenous  PONV Risk Score and Plan: Ondansetron, Dexamethasone, Midazolam and Treatment may vary due to age or medical condition  Airway Management Planned: Oral ETT  Additional Equipment:   Intra-op Plan:   Post-operative Plan: Extubation in OR  Informed Consent: I have reviewed the patients History and Physical, chart, labs and discussed the procedure including the risks, benefits and alternatives for the proposed anesthesia with the patient or authorized representative who has indicated his/her understanding and acceptance.     Dental Advisory Given  Plan Discussed with: Anesthesiologist, CRNA and Surgeon  Anesthesia Plan Comments: (Patient consented for risks of anesthesia including but not limited to:  - adverse reactions to medications - damage to eyes, teeth, lips or other oral mucosa - nerve damage due to positioning  - sore throat or hoarseness - Damage to heart, brain, nerves, lungs, other parts of body or loss of life  Patient voiced understanding.)        Anesthesia Quick Evaluation

## 2020-08-27 NOTE — Op Note (Signed)
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  Post-Op Diagnosis:   Same  Procedure:   Reverse left total shoulder arthroplasty with biceps tenodesis.  Surgeon:   Pascal Lux, MD  Assistant:   Cameron Proud, PA-C; Ardelle Park, PA-S  Anesthesia:   General endotracheal with an interscalene block using Exparel placed preoperatively by the anesthesiologist.  Findings:   As above.  Complications:   None  EBL:    200 cc  Fluids:   1100 cc crystalloid  UOP:   None  TT:   None  Drains:   None  Closure:   Staples  Implants:   All press-fit Biomet Comprehensive system with a #18 micro-humeral stem, a 40 mm humeral tray with a +3 mm insert, and a mini-base plate with a 40 mm glenosphere.  Brief Clinical Note:   The patient is a 60 year old male with a history of progressively worsening left shoulder pain and stiffness. He is now 6 years status post a left shoulder arthroscopy with debridement, decompression, and rotator cuff repair his symptoms have persisted despite medications, activity modification, etc. His history and examination consistent with advanced degenerative joint disease, confirmed by plain radiographs and MRI scan.  His preoperative MRI scan showed some tendinopathy, but no recurrent rotator cuff tears. The patient presents at this time for a standard versus reverse left total shoulder arthroplasty.  Procedure:   The patient underwent placement of an interscalene block using Exparel by the anesthesiologist in the preoperative holding area before being brought into the operating room and lain in the supine position. The patient then underwent general endotracheal intubation and anesthesia before the patient was repositioned in the beach chair position using the beach chair positioner. The left shoulder and upper extremity were prepped with ChloraPrep solution before  being draped sterilely. Preoperative antibiotics were administered. A timeout was performed to verify the appropriate surgical site.    A standard anterior approach to the shoulder was made through an approximately 4-5 inch incision. The incision was carried down through the subcutaneous tissues to expose the deltopectoral fascia. The interval between the deltoid and pectoralis muscles was identified and this plane developed, retracting the cephalic vein laterally with the deltoid muscle. The conjoined tendon was identified. Its lateral margin was dissected and the Kolbel self-retraining retractor inserted. The "three sisters" were identified and cauterized. Bursal tissues were removed to improve visualization. The subscapularis tendon was released from its attachment to the lesser tuberosity 1 cm proximal to its insertion and several tagging sutures placed.  Numerous large osteophytes were removed off the inferior, superior, and posterior aspects of the humeral head in order to improve visualization. The inferior capsule was released off the humeral neck with care after identifying and protecting the axillary nerve.   Given the significant dissection required to remove the osteophytes, concern was raised that the rotator cuff insertion would have been compromised. Furthermore, exposure of the glenoid was still quite difficult, so some of the supraspinatus required release in order to permit sufficient mobilization of the proximal humerus to visualize the glenoid. Therefore, it was elected to proceed with a reverse total shoulder arthroplasty rather than a standard total shoulder arthroplasty. The proximal humeral cut was made at approximately 25 of retroversion using the extra-medullary guide.   Attention was redirected to the glenoid. The labrum was debrided circumferentially before the center of the glenoid was marked with electrocautery.  The guidewire was drilled into the glenoid neck using the  appropriate guide. After verifying its position, it was overreamed with the mini-baseplate reamer to create a flat surface. The permanent mini-baseplate was impacted into place. It was stabilized with a 35 x 6.5 mm central screw and four peripheral screws. Locking screws were placed superiorly and inferiorly while nonlocking screws were placed anteriorly and posteriorly. The permanent 40 mm glenosphere was then impacted into place and its Morse taper locking mechanism verified using manual distraction.  Attention was directed to the humeral side. The humeral canal was reamed sequentially beginning with the end-cutting reamer then progressing from a 4 mm reamer up to an 18 mm reamer. This provided excellent circumferential chatter. The canal was broached beginning with a #15 broach and progressing to a #18 broach. This was left in place and several trial reductions performed using the standard and +3 mm trial humeral platforms. With the +3 mm trial platform, he arm demonstrated excellent range of motion as the hand could be brought across the chest to the opposite shoulder and brought to the top of the patient's head and to the patient's ear. The shoulder appeared stable throughout this range of motion. The joint was dislocated and the trial components removed. The permanent #18 micro-stem was impacted into place with care taken to maintain the appropriate version. The permanent 40 mm humeral platform with the +3 mm insert was put together on the back table and impacted into place. Again, the [facility] taper locking mechanism was verified using manual distraction. The shoulder was relocated using two finger pressure and again placed through a range of motion with the findings as described above.  The wound was copiously irrigated with sterile saline solution using the jet lavage system before a total of 30 cc of 0.5% Sensorcaine with epinephrine was injected into the pericapsular and peri-incisional tissues to help  with postoperative analgesia. The subscapularis tendon was reapproximated using #2 FiberWire interrupted sutures. The deltopectoral interval was closed using #0 Vicryl interrupted sutures before the subcutaneous tissues were closed using 2-0 Vicryl interrupted sutures. The skin was closed using staples. Prior to closing the skin, 1 g of transexemic acid in 10 cc of normal saline was injected intra-articularly to help with postoperative bleeding. A sterile occlusive dressing was applied to the wound before the arm was placed into a shoulder immobilizer with an abduction pillow. A Polar Care system also was applied to the shoulder. The patient was then transferred back to a hospital bed before being awakened, extubated, and returned to the recovery room in satisfactory condition after tolerating the procedure well.

## 2020-08-27 NOTE — Anesthesia Procedure Notes (Signed)
Procedure Name: Intubation Date/Time: 08/27/2020 10:56 AM Performed by: Marshell Garfinkel, RN Pre-anesthesia Checklist: Patient identified, Emergency Drugs available, Suction available and Patient being monitored Patient Re-evaluated:Patient Re-evaluated prior to induction Oxygen Delivery Method: Circle system utilized Preoxygenation: Pre-oxygenation with 100% oxygen Induction Type: IV induction Ventilation: Mask ventilation without difficulty, Oral airway inserted - appropriate to patient size and Two handed mask ventilation required Laryngoscope Size: McGraph and 4 Grade View: Grade I Tube type: Oral Tube size: 7.5 mm Number of attempts: 1 Airway Equipment and Method: Stylet and Oral airway Placement Confirmation: ETT inserted through vocal cords under direct vision, positive ETCO2 and breath sounds checked- equal and bilateral Tube secured with: Tape Dental Injury: Teeth and Oropharynx as per pre-operative assessment

## 2020-08-28 ENCOUNTER — Encounter: Payer: Self-pay | Admitting: Surgery

## 2020-08-28 LAB — CBC
HCT: 37.8 % — ABNORMAL LOW (ref 39.0–52.0)
Hemoglobin: 12.7 g/dL — ABNORMAL LOW (ref 13.0–17.0)
MCH: 34.1 pg — ABNORMAL HIGH (ref 26.0–34.0)
MCHC: 33.6 g/dL (ref 30.0–36.0)
MCV: 101.6 fL — ABNORMAL HIGH (ref 80.0–100.0)
Platelets: 209 10*3/uL (ref 150–400)
RBC: 3.72 MIL/uL — ABNORMAL LOW (ref 4.22–5.81)
RDW: 12.8 % (ref 11.5–15.5)
WBC: 14.6 10*3/uL — ABNORMAL HIGH (ref 4.0–10.5)
nRBC: 0 % (ref 0.0–0.2)

## 2020-08-28 LAB — BASIC METABOLIC PANEL
Anion gap: 4 — ABNORMAL LOW (ref 5–15)
BUN: 12 mg/dL (ref 6–20)
CO2: 28 mmol/L (ref 22–32)
Calcium: 8.3 mg/dL — ABNORMAL LOW (ref 8.9–10.3)
Chloride: 106 mmol/L (ref 98–111)
Creatinine, Ser: 0.76 mg/dL (ref 0.61–1.24)
GFR, Estimated: >60 mL/min (ref >60)
Glucose, Bld: 133 mg/dL — ABNORMAL HIGH (ref 70–99)
Potassium: 4.7 mmol/L (ref 3.5–5.1)
Sodium: 138 mmol/L (ref 135–145)

## 2020-08-28 LAB — GLUCOSE, CAPILLARY
Glucose-Capillary: 124 mg/dL — ABNORMAL HIGH (ref 70–99)
Glucose-Capillary: 119 mg/dL — ABNORMAL HIGH (ref 70–99)

## 2020-08-28 MED ORDER — TRAMADOL HCL 50 MG PO TABS: 50.0000 mg | ORAL_TABLET | Freq: Four times a day (QID) | ORAL | 0 refills | Status: DC | PRN

## 2020-08-28 MED ORDER — OXYCODONE HCL 5 MG PO TABS: 5.0000 mg | ORAL_TABLET | ORAL | 0 refills | Status: DC | PRN

## 2020-08-28 MED ORDER — ONDANSETRON HCL 4 MG PO TABS: 4.0000 mg | ORAL_TABLET | Freq: Four times a day (QID) | ORAL | 0 refills | Status: DC | PRN

## 2020-08-28 MED ORDER — ASPIRIN EC 325 MG PO TBEC: 325.0000 mg | DELAYED_RELEASE_TABLET | Freq: Every day | ORAL | 0 refills | Status: DC

## 2020-08-28 NOTE — Discharge Summary (Signed)
Physician Discharge Summary  Patient ID: Lee Lucas MRN: [id number] DOB/AGE: [birth year] 60 y.o.  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   Consultants (if any):   Discharged Condition: Improved  Hospital Course: Lee Lucas is an 60 y.o. male who was admitted 08/27/2020 with a diagnosis of severe degenerative joint disease with rotator cuff tendinopathy of the left shoulder and went to the operating room on 08/27/2020 and underwent the above named procedures.    Surgeries: Procedure(s): REVERSE SHOULDER ARTHROPLASTY BICEPS TENODESIS on 08/27/2020 Patient tolerated the surgery well. Taken to PACU where she was stabilized and then transferred to the orthopedic floor.  Started on Lovenox '40mg'$  q 24 hrs. Foot pumps applied bilaterally at 80 mm. Heels elevated on bed with rolled towels. No evidence of DVT. Negative Homan. Physical therapy started on day #1 for gait training and transfer. OT started day #1 for ADL and assisted devices.  Patient's IV was removed on POD1.  Implants: All press-fit Biomet Comprehensive system with a #18 micro-humeral stem, a 40 mm humeral tray with a +3 mm insert, and a mini-base plate with a 40 mm glenosphere.  He was given perioperative antibiotics:  Anti-infectives (From admission, onward)    Start     Dose/Rate Route Frequency Ordered Stop   08/27/20 1700  ceFAZolin (ANCEF) IVPB 2g/100 mL premix         2 g 200 mL/hr over 30 Minutes Intravenous Every 6 hours 08/27/20 1555 08/28/20 0627   08/27/20 0818  ceFAZolin (ANCEF) 2-4 GM/100ML-% IVPB       Note to Pharmacy: [facility]   : cabinet override      08/27/20 0818 08/27/20 1112   08/27/20 0600  ceFAZolin (ANCEF) IVPB 2g/100 mL premix        2 g 200 mL/hr over 30 Minutes Intravenous On call to O.R. 08/26/20 2313 08/27/20 1058     .  He was given sequential compression devices, early ambulation, and Lovenox for DVT prophylaxis.  He benefited maximally from the hospital stay and there were no complications.    Recent vital signs:  Vitals:   08/27/20 2020 08/28/20 0332  BP: 125/65 137/78  Pulse: 62 72  Resp: 16 17  Temp: 97.9 F (36.6 C) 97.7 F (36.5 C)  SpO2: 94% 93%    Recent laboratory studies:  Lab Results  Component Value Date   HGB 12.7 (L) 08/28/2020   HGB 13.4 08/19/2020   Lab Results  Component Value Date   WBC 14.6 (H) 08/28/2020   PLT 209 08/28/2020   No results found for: INR Lab Results  Component Value Date   NA 138 08/28/2020   K 4.7 08/28/2020   CL 106 08/28/2020   CO2 28 08/28/2020   BUN 12 08/28/2020   CREATININE 0.76 08/28/2020   GLUCOSE 133 (H) 08/28/2020    Discharge Medications:   Allergies as of 08/28/2020   No  Known Allergies      Medication List     STOP taking these medications    aspirin 81 MG tablet Replaced by: aspirin EC 325 MG tablet       TAKE these medications    aspirin EC 325 MG tablet Take 1 tablet (325 mg total) by mouth daily. Replaces: aspirin 81 MG tablet   atorvastatin 40 MG tablet Commonly known as: LIPITOR Take 40 mg by mouth at bedtime.   furosemide 40 MG tablet Commonly known as: LASIX Take 40 mg by mouth daily.   lisinopril 10 MG tablet Commonly known as: ZESTRIL Take 10 mg by mouth daily.   metFORMIN 1000 MG tablet Commonly known as: GLUCOPHAGE Take 1,000 mg by mouth 2 (two) times daily with a meal.   ondansetron 4 MG  tablet Commonly known as: ZOFRAN Take 1 tablet (4 mg total) by mouth every 6 (six) hours as needed for nausea.   oxyCODONE 5 MG immediate release tablet Commonly known as: Oxy IR/ROXICODONE Take 1-2 tablets (5-10 mg total) by mouth every 4 (four) hours as needed for moderate pain (pain score 4-6).   pioglitazone 30 MG tablet Commonly known as: ACTOS Take 30 mg by mouth daily.   traMADol 50 MG tablet Commonly known as: ULTRAM Take 1 tablet (50 mg total) by mouth every 6 (six) hours as needed for moderate pain.   Vitamin D (Ergocalciferol) 1.25 MG (50000 UNIT) Caps capsule Commonly known as: DRISDOL Take 50,000 Units by mouth once a week.        Diagnostic Studies: MR SHOULDER LEFT WO CONTRAST  Result Date: 07/30/2020 CLINICAL DATA:  Chronic left shoulder pain and limited range of motion. History of left shoulder surgery approximately 5 years ago. EXAM: MRI OF THE LEFT SHOULDER WITHOUT CONTRAST TECHNIQUE: Multiplanar, multisequence MR imaging of the shoulder was performed. No intravenous contrast was administered. COMPARISON:  None. FINDINGS: Rotator cuff: Intact. Mild appearing supraspinatus and infraspinatus tendinopathy noted. Muscles: There is mild to moderate atrophy all imaged musculature. No focal lesion. Biceps long head: Status post biceps tenodesis without evidence of complication. Acromioclavicular Joint: Normal. Type 1 acromion. No subacromial/subdeltoid bursal fluid. Glenohumeral Joint: The patient has severe, markedly advanced for age glenohumeral osteoarthritis which is much worse than on the prior study. Cartilage is completely denuded and there is very bulky osteophytosis off the humeral head. Mild subchondral edema is present in the glenoid. No notable subchondral cyst formation. Labrum: The anterior and inferior labrum are not seen consistent with degenerative maceration. Bones: There is some artifact in the humeral head consistent with prior rotator cuff repair. There is  new exophytic bone medial to the greater tuberosity measuring 2.1 cm by 0.6 cm craniocaudal transverse and extending across the entire AP diameter of the humerus. No fracture or worrisome lesion is identified. Other: None. 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 Status post rotator cuff repair. The cuff is intact. New exophytic bone just medial to the greater tuberosity results in some mass effect on the cuff may be posttraumatic or postsurgical. Mild to moderate atrophy of all imaged musculature about the shoulder is presumably secondary to disuse. Electronically Signed   By: Inge Rise M.D.   On: 07/30/2020 12:54   DG Shoulder Left Port  Result Date: 08/27/2020 CLINICAL DATA:  Postop. EXAM: LEFT SHOULDER COMPARISON:  None. FINDINGS: Reverse left shoulder arthroplasty in expected alignment. There is no periprosthetic lucency or fracture. Recent postsurgical change includes air  and edema in the soft tissues with skin staples in place. IMPRESSION: Reverse left shoulder arthroplasty without immediate postoperative complication. Electronically Signed   By: Keith Rake M.D.   On: 08/27/2020 16:03   [place] OR NERVE BLOCK-IMAGE ONLY [facility])  Result Date: 08/27/2020 There is no interpretation for this exam.  This order is for images obtained during a surgical procedure.  Please See "Surgeries" Tab for more information regarding the procedure.    Disposition: Plan for discharge home this afternoon pending progress with PT.    Follow-up Information     Lattie Corns, PA-C Follow up in 14 day(s).   Specialty: Physician Assistant Why: Electa Sniff information: [place] [id number] [phone]                Signed: Judson Roch PA-C 08/28/2020, 7:35 AM

## 2020-08-28 NOTE — Anesthesia Postprocedure Evaluation (Signed)
Anesthesia Post Note  Patient: Lee Lucas  Procedure(s) Performed: REVERSE SHOULDER ARTHROPLASTY (Left: Shoulder) BICEPS TENODESIS  Patient location during evaluation: PACU Anesthesia Type: General Level of consciousness: awake and alert Pain management: pain level controlled Vital Signs Assessment: post-procedure vital signs reviewed and stable Respiratory status: spontaneous breathing, nonlabored ventilation, respiratory function stable and patient connected to nasal cannula oxygen Cardiovascular status: blood pressure returned to baseline and stable Postop Assessment: no apparent nausea or vomiting Anesthetic complications: no   No notable events documented.   Last Vitals:  Vitals:   08/27/20 2020 08/28/20 0332  BP: 125/65 137/78  Pulse: 62 72  Resp: 16 17  Temp: 36.6 C 36.5 C  SpO2: 94% 93%    Last Pain:  Vitals:   08/28/20 0332  TempSrc: Oral  PainSc:                  Precious Haws Akylah Hascall

## 2020-08-28 NOTE — Progress Notes (Signed)
Pt discharged home, all instructions provided with questions answered.  BP (!) 111/53 (BP Location: Right Arm)   Pulse 69   Temp 98.2 F (36.8 C)   Resp 15   Ht '5\' 11"'$  (1.803 m)   Wt 115.7 kg   SpO2 94%   BMI 35.58 kg/m

## 2020-08-28 NOTE — Discharge Instructions (Signed)
Diet: As you were doing prior to hospitalization   Shower:  May shower but keep the wounds dry, use an occlusive plastic wrap, NO SOAKING IN TUB.  If the bandage gets wet, change with a clean dry gauze.  Dressing:  You may change your dressing as needed. Change the dressing with sterile gauze dressing.    Activity:  Increase activity slowly as tolerated, but follow the weight bearing instructions below.  No lifting or driving for 6 weeks.  Weight Bearing:   Non-weightbearing to the left arm.  Blood Clot Prevention: Take 1 '325mg'$  aspirin for blood clot prevention.  To prevent constipation: you may use a stool softener such as -  Colace (over the counter) 100 mg by mouth twice a day  Drink plenty of fluids (prune juice may be helpful) and high fiber foods Miralax (over the counter) for constipation as needed.    Itching:  If you experience itching with your medications, try taking only a single pain pill, or even half a pain pill at a time.  You may take up to 10 pain pills per day, and you can also use benadryl over the counter for itching or also to help with sleep.   Precautions:  If you experience chest pain or shortness of breath - call 911 immediately for transfer to the hospital emergency department!!  If you develop a fever greater that 101 F, purulent drainage from wound, increased redness or drainage from wound, or calf pain-Call [facility]                                              Follow- Up Appointment:  Please call for an appointment to be seen in 2 weeks at [facility]

## 2020-08-28 NOTE — Plan of Care (Signed)
  Problem: Education: Goal: Knowledge of General Education information will improve Description: Including pain rating scale, medication(s)/side effects and non-pharmacologic comfort measures 08/28/2020 1328 by Jules Schick, RN Outcome: Completed/Met 08/28/2020 0804 by Jules Schick, RN Outcome: Progressing   Problem: Health Behavior/Discharge Planning: Goal: Ability to manage health-related needs will improve 08/28/2020 1328 by Jules Schick, RN Outcome: Completed/Met 08/28/2020 0804 by Jules Schick, RN Outcome: Progressing   Problem: Clinical Measurements: Goal: Ability to maintain clinical measurements within normal limits will improve 08/28/2020 1328 by Jules Schick, RN Outcome: Completed/Met 08/28/2020 0804 by Jules Schick, RN Outcome: Progressing Goal: Will remain free from infection 08/28/2020 1328 by Jules Schick, RN Outcome: Completed/Met 08/28/2020 0804 by Jules Schick, RN Outcome: Progressing Goal: Diagnostic test results will improve 08/28/2020 1328 by Jules Schick, RN Outcome: Completed/Met 08/28/2020 0804 by Jules Schick, RN Outcome: Progressing Goal: Respiratory complications will improve 08/28/2020 1328 by Jules Schick, RN Outcome: Completed/Met 08/28/2020 0804 by Jules Schick, RN Outcome: Progressing Goal: Cardiovascular complication will be avoided 08/28/2020 1328 by Jules Schick, RN Outcome: Completed/Met 08/28/2020 0804 by Jules Schick, RN Outcome: Progressing   Problem: Activity: Goal: Risk for activity intolerance will decrease 08/28/2020 1328 by Jules Schick, RN Outcome: Completed/Met 08/28/2020 0804 by Jules Schick, RN Outcome: Progressing   Problem: Elimination: Goal: Will not experience complications related to bowel motility 08/28/2020 1328 by Jules Schick, RN Outcome: Completed/Met 08/28/2020 0804 by Jules Schick, RN Outcome: Progressing Goal: Will not experience  complications related to urinary retention 08/28/2020 1328 by Jules Schick, RN Outcome: Completed/Met 08/28/2020 0804 by Jules Schick, RN Outcome: Progressing   Problem: Pain Managment: Goal: General experience of comfort will improve 08/28/2020 1328 by Jules Schick, RN Outcome: Completed/Met 08/28/2020 0804 by Jules Schick, RN Outcome: Progressing   Problem: Skin Integrity: Goal: Risk for impaired skin integrity will decrease 08/28/2020 1328 by Jules Schick, RN Outcome: Completed/Met 08/28/2020 0804 by Jules Schick, RN Outcome: Progressing   Problem: Safety: Goal: Ability to remain free from injury will improve 08/28/2020 1328 by Jules Schick, RN Outcome: Completed/Met 08/28/2020 0804 by Jules Schick, RN Outcome: Progressing

## 2020-08-28 NOTE — Progress Notes (Signed)
  Subjective: 1 Day Post-Op Procedure(s) (LRB): REVERSE SHOULDER ARTHROPLASTY (Left) BICEPS TENODESIS Patient reports pain as mild.   Patient is well, and has had no acute complaints or problems Left arm still numb from recent nerve block Plan is to go Home after hospital stay. Negative for chest pain and shortness of breath Fever: no Gastrointestinal:Negative for nausea and vomiting States that he is passing alittle bit of gas.  Objective: Vital signs in last 24 hours: Temp:  [97 F (36.1 C)-98.5 F (36.9 C)] 97.7 F (36.5 C) (08/19 0332) Pulse Rate:  [48-80] 72 (08/19 0332) Resp:  [14-17] 17 (08/19 0332) BP: (110-144)/(58-78) 137/78 (08/19 0332) SpO2:  [92 %-100 %] 93 % (08/19 0332) Weight:  [115.7 kg] 115.7 kg (08/18 0832)  Intake/Output from previous day:  Intake/Output Summary (Last 24 hours) at 08/28/2020 0731 Last data filed at 08/27/2020 1611 Gross per 24 hour  Intake 1300 ml  Output 200 ml  Net 1100 ml    Intake/Output this shift: No intake/output data recorded.  Labs: Recent Labs    08/28/20 0302  HGB 12.7*   Recent Labs    08/28/20 0302  WBC 14.6*  RBC 3.72*  HCT 37.8*  PLT 209   Recent Labs    08/27/20 1700 08/28/20 0302  NA  --  138  K  --  4.7  CL  --  106  CO2  --  28  BUN  --  12  CREATININE 0.67 0.76  GLUCOSE  --  133*  CALCIUM  --  8.3*   No results for input(s): LABPT, INR in the last 72 hours.   EXAM General - Patient is Alert, Appropriate, and Oriented Extremity - Incision: dressing C/D/I No cellulitis present Compartment soft Decreased sensation to light touch to the left arm. Able to flex and extend fingers this AM. Dressing/Incision - clean, dry, no drainage Motor Function - intact, moving foot and toes well on exam.  Abdomen with some distention and tympany.  Normal, intact bowel sounds.  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   Weight as of this encounter: 115.7 kg. Advance diet Up with therapy D/C IV fluids when tolerating po intake.  Labs and Vitals reviewed this AM.  WBC 14.6. Up with therapy today. Patient is passing some gas, work on BM. Plan for possible d/c home this afternoon pending progress with PT.  DVT Prophylaxis - Lovenox, Foot Pumps, and TED hose Non-weightbearing to the left arm.  Raquel Skiler Olden, PA-C [facility] 08/28/2020, 7:31 AM

## 2020-08-28 NOTE — Evaluation (Signed)
Occupational Therapy Evaluation Patient Details Name: Lee Lucas MRN: [id number] DOB: September 12, 1960 Today's Date: 08/28/2020    History of Present Illness Pt admitted for L reverse shoulder with biceps tendoesis. History includes cancer, GERD, and HTN.   Clinical Impression   Lee Lucas was seen for OT evaluation this date. Prior to hospital admission, pt was Independent for mobility and I/ADLs. 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 Pt presents to acute OT demonstrating impaired ADL performance and functional mobility 2/2 functional impaired use of non-dominant LUE and ROM/strength deficits. Pt called son during session and son agreeable to check in AM and PM to assist (states already checks in daily for pt's mother).   Pt currently requires MIN cues sup>sit, no physical assist to exit bed but cues to maintain NWBing pcns as pt rolls onto L arm and pushes from elbow to rise from bed. MOD A don/doff sling and polar care seated EOC. SUPERVISION toileting standing at commode. SUPERVISION + increased time don underwear and shorts.   Pt instructed in polar care mgt, sling/immobilizer mgt, ROM exercises for LUE (with instructions for no shoulder exercises until full sensation has returned), functional application of LUE precautions, adaptive strategies for bathing/dressing, positioning for sleep, and home/routines modifications. Handout provided. Pt would benefit from skilled OT to address noted impairments and functional limitations to maximize safety and independence. Upon hospital discharge, recommend HHOT to maximize pt safety and return to functional independence during meaningful occupations of daily life.     Follow Up Recommendations  Home health OT;Supervision - Intermittent    Equipment Recommendations  None recommended by OT    Recommendations for Other Services       Precautions / Restrictions Precautions Precautions: Shoulder Shoulder  Interventions: Shoulder sling/immobilizer;Shoulder abduction pillow;At all times;Off for dressing/bathing/exercises Precaution Booklet Issued: Yes (comment) Restrictions Weight Bearing Restrictions: Yes LUE Weight Bearing: Non weight bearing      Mobility Bed Mobility Overal bed mobility: Needs Assistance Bed Mobility: Supine to Sit     Supine to sit: Min assist     General bed mobility comments: no physical assist to exit bed but cues to maintain NWBing pcns as pt rolls onto L arm and pushes from elbow to rise from bed.    Transfers Overall transfer level: Needs assistance Equipment used: None Transfers: Sit to/from Stand Sit to Stand: Supervision              Balance Overall balance assessment: Needs assistance Sitting-balance support: Feet supported Sitting balance-Leahy Scale: Good     Standing balance support: No upper extremity supported Standing balance-Leahy Scale: Fair                             ADL either performed or assessed with clinical judgement   ADL Overall ADL's : Needs assistance/impaired                                       General ADL Comments: MOD A don/doff sling and polar care seated EOC. SUPERVISION toileting standing at commode. SUPERVISION + increased time don underwear and shorts      Pertinent Vitals/Pain Pain Assessment: No/denies pain     Hand Dominance Right   Extremity/Trunk Assessment Upper Extremity Assessment Upper Extremity Assessment: LUE deficits/detail LUE: Unable to fully assess due to immobilization   Lower  Extremity Assessment Lower Extremity Assessment: Overall WFL for tasks assessed       Communication Communication Communication: No difficulties   Cognition Arousal/Alertness: Awake/alert Behavior During Therapy: WFL for tasks assessed/performed Overall Cognitive Status: Within Functional Limits for tasks assessed                                      General Comments       Exercises Exercises: Other exercises Other Exercises Other Exercises: Pt educated re; OT role, DME recs, d/c recs, falls prevention, polar care mgmt, sling mgmt, adapted dressing techniques, Other Exercises: LBD, UBD, don/doff sling/polar care, toileting, sup>sit, sit<>stand, sitting/standing balance/tolerance   Shoulder Instructions      Home Living Family/patient expects to be discharged to:: Private residence Living Arrangements: Parent Available Help at Discharge: Family;Neighbor;Available PRN/intermittently Type of Home: House Home Access: Ramped entrance     Home Layout: One level               Home Equipment: None   Additional Comments: 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 Reports no falls, indep prior        OT Problem List: Decreased strength;Decreased range of motion;Decreased activity tolerance;Impaired balance (sitting and/or standing);Decreased coordination;Decreased safety awareness      OT Treatment/Interventions: Self-care/ADL training;Therapeutic exercise;Energy conservation;DME and/or AE instruction;Therapeutic activities;Balance training;Patient/family education    OT Goals(Current goals can be found in the care plan section) Acute Rehab OT Goals Patient Stated Goal: to go home OT Goal Formulation: With patient/family Time For Goal Achievement: 09/11/20 Potential to Achieve Goals: Good ADL Goals Pt Will Perform Grooming: Independently;standing Pt Will Perform Upper Body Dressing: with supervision;sitting Pt Will Transfer to Toilet: Independently;ambulating;regular height toilet  OT Frequency: Min 2X/week    AM-PAC OT "6 Clicks" Daily Activity     Outcome Measure Help from another person eating meals?: None Help from another person taking care of personal grooming?: None Help from another person toileting,  which includes using toliet, bedpan, or urinal?: A Little Help from another person bathing (including washing, rinsing, drying)?: A Little Help from another person to put on and taking off regular upper body clothing?: A Lot Help from another person to put on and taking off regular lower body clothing?: None 6 Click Score: 20   End of Session    Activity Tolerance: Patient tolerated treatment well Patient left: in chair;with call bell/phone within reach;with chair alarm set  OT Visit Diagnosis: Other abnormalities of gait and mobility (R26.89)                Time: [id number] OT Time Calculation (min): 37 min Charges:  OT General Charges $OT Visit: 1 Visit OT Evaluation $OT Eval Low Complexity: 1 Low OT Treatments $Self Care/Home Management : 23-37 mins  Dessie Coma, M.S. OTR/L  08/28/20, 12:56 PM  ascom [phone]

## 2020-08-28 NOTE — Plan of Care (Signed)

## 2020-08-28 NOTE — Progress Notes (Signed)
Physical Therapy Treatment Patient Details Name: Lee Lucas MRN: [id number] DOB: 02-07-1960 Today's Date: 08/28/2020    History of Present Illness Pt admitted for L reverse shoulder with biceps tendoesis. History includes cancer, GERD, and HTN.    PT Comments    Pt was in recliner upon arriving. He agrees to PT session and is cooperative and motivated throughout. Is eager to DC home. He had OT session prior to PT session. No difficulty standing and ambulating without AD. Easily able to stand and ambulate to BR to urinate prior to ambulating 300 ft in hallway without safety concern. Pt lives with elderly mother.  Has son who is willing to check on him daily. He did demonstrate safe  abilities to DC home safely. Recommend HHPT to follow.    Follow Up Recommendations  Home health PT;Supervision for mobility/OOB     Equipment Recommendations  3in1 (PT)       Precautions / Restrictions Precautions Precautions: Shoulder Shoulder Interventions: Shoulder sling/immobilizer;Shoulder abduction pillow;At all times;Off for dressing/bathing/exercises Precaution Booklet Issued: Yes (comment) Restrictions Weight Bearing Restrictions: Yes LUE Weight Bearing: Non weight bearing    Mobility  Bed Mobility Overal bed mobility: Needs Assistance Bed Mobility: Supine to Sit     Supine to sit: Min guard;Supervision     General bed mobility comments: pt was able to stand from recliner and EOB height without AD    Transfers Overall transfer level: Needs assistance Equipment used: None Transfers: Sit to/from Stand Sit to Stand: Supervision    Ambulation/Gait Ambulation/Gait assistance: Supervision Gait Distance (Feet): 300 Feet Assistive device: None Gait Pattern/deviations: WFL(Within Functional Limits);Drifts right/left Gait velocity: decreased   General Gait Details: pt demonstrated safe ability to ambulate hallway without AD or LOB     Balance Overall balance assessment: Needs  assistance Sitting-balance support: Feet supported Sitting balance-Leahy Scale: Good     Standing balance support: No upper extremity supported Standing balance-Leahy Scale: Good Standing balance comment: he was able to ambulate > 200 ft without AD without LOB        Cognition Arousal/Alertness: Awake/alert Behavior During Therapy: WFL for tasks assessed/performed Overall Cognitive Status: Within Functional Limits for tasks assessed        General Comments: Pt is A and O x 4      Exercises Other Exercises Other Exercises: Pt educated re; OT role, DME recs, d/c recs, falls prevention, polar care mgmt, sling mgmt, adapted dressing techniques, Other Exercises: LBD, UBD, don/doff sling/polar care, toileting, sup>sit, sit<>stand, sitting/standing balance/tolerance        Pertinent Vitals/Pain Pain Assessment: No/denies pain    Home Living Family/patient expects to be discharged to:: Private residence Living Arrangements: Parent Available Help at Discharge: Family;Neighbor;Available PRN/intermittently Type of Home: House Home Access: Ramped entrance   Home Layout: One level Home Equipment: None 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 Reports no falls, indep prior   PT Goals (current goals can now be found in the care plan section) Acute Rehab PT Goals Patient Stated Goal: to go home Progress towards PT goals: Progressing toward goals    Frequency    BID      PT Plan Current plan remains appropriate       AM-PAC PT "6 Clicks" Mobility   Outcome Measure  Help needed turning from your back to your side while in a flat bed without using bedrails?: A Little  Help needed moving from lying on your back to sitting on the side of a flat bed without using bedrails?: A Little Help needed moving to and from a bed to a chair (including a wheelchair)?: A  Little Help needed standing up from a chair using your arms (e.g., wheelchair or bedside chair)?: A Little Help needed to walk in hospital room?: A Little Help needed climbing 3-5 steps with a railing? : A Little 6 Click Score: 18    End of Session Equipment Utilized During Treatment: Gait belt Activity Tolerance: Patient tolerated treatment well Patient left: in chair;with chair alarm set;with nursing/sitter in room;with SCD's reapplied Nurse Communication: Mobility status PT Visit Diagnosis: Unsteadiness on feet (R26.81);Muscle weakness (generalized) (M62.81);Pain;Difficulty in walking, not elsewhere classified (R26.2) Pain - Right/Left: Left Pain - part of body: Shoulder     Time: 1100-1110 PT Time Calculation (min) (ACUTE ONLY): 10 min  Charges:  $Gait Training: 8-22 mins                    Julaine Fusi PTA 08/28/20, 1:07 PM

## 2020-08-31 LAB — SURGICAL PATHOLOGY

## 2021-04-28 DIAGNOSIS — Z0000 Encounter for general adult medical examination without abnormal findings: Secondary | ICD-10-CM | POA: Diagnosis not present

## 2021-04-28 DIAGNOSIS — E119 Type 2 diabetes mellitus without complications: Secondary | ICD-10-CM | POA: Diagnosis not present

## 2021-04-28 DIAGNOSIS — R972 Elevated prostate specific antigen [PSA]: Secondary | ICD-10-CM | POA: Diagnosis not present

## 2021-04-28 DIAGNOSIS — I10 Essential (primary) hypertension: Secondary | ICD-10-CM | POA: Diagnosis not present

## 2021-04-28 DIAGNOSIS — E7800 Pure hypercholesterolemia, unspecified: Secondary | ICD-10-CM | POA: Diagnosis not present

## 2021-05-20 DIAGNOSIS — M2041 Other hammer toe(s) (acquired), right foot: Secondary | ICD-10-CM | POA: Diagnosis not present

## 2021-05-20 DIAGNOSIS — E119 Type 2 diabetes mellitus without complications: Secondary | ICD-10-CM | POA: Diagnosis not present

## 2021-05-20 DIAGNOSIS — L851 Acquired keratosis [keratoderma] palmaris et plantaris: Secondary | ICD-10-CM | POA: Diagnosis not present

## 2021-05-20 DIAGNOSIS — M2011 Hallux valgus (acquired), right foot: Secondary | ICD-10-CM | POA: Diagnosis not present

## 2021-11-23 DIAGNOSIS — I10 Essential (primary) hypertension: Secondary | ICD-10-CM | POA: Diagnosis not present

## 2021-11-23 DIAGNOSIS — E119 Type 2 diabetes mellitus without complications: Secondary | ICD-10-CM | POA: Diagnosis not present

## 2021-11-23 DIAGNOSIS — E7800 Pure hypercholesterolemia, unspecified: Secondary | ICD-10-CM | POA: Diagnosis not present

## 2022-01-17 DIAGNOSIS — E119 Type 2 diabetes mellitus without complications: Secondary | ICD-10-CM | POA: Diagnosis not present

## 2022-01-17 DIAGNOSIS — M778 Other enthesopathies, not elsewhere classified: Secondary | ICD-10-CM | POA: Diagnosis not present

## 2022-01-17 DIAGNOSIS — M2011 Hallux valgus (acquired), right foot: Secondary | ICD-10-CM | POA: Diagnosis not present

## 2022-01-17 DIAGNOSIS — M2041 Other hammer toe(s) (acquired), right foot: Secondary | ICD-10-CM | POA: Diagnosis not present

## 2022-01-28 DIAGNOSIS — L84 Corns and callosities: Secondary | ICD-10-CM | POA: Diagnosis not present

## 2022-01-28 DIAGNOSIS — M2012 Hallux valgus (acquired), left foot: Secondary | ICD-10-CM | POA: Diagnosis not present

## 2022-01-28 DIAGNOSIS — M2041 Other hammer toe(s) (acquired), right foot: Secondary | ICD-10-CM | POA: Diagnosis not present

## 2022-01-28 DIAGNOSIS — M2011 Hallux valgus (acquired), right foot: Secondary | ICD-10-CM | POA: Diagnosis not present

## 2022-01-28 DIAGNOSIS — M19071 Primary osteoarthritis, right ankle and foot: Secondary | ICD-10-CM | POA: Diagnosis not present

## 2022-01-28 DIAGNOSIS — M2042 Other hammer toe(s) (acquired), left foot: Secondary | ICD-10-CM | POA: Diagnosis not present

## 2022-01-28 DIAGNOSIS — M216X1 Other acquired deformities of right foot: Secondary | ICD-10-CM | POA: Diagnosis not present

## 2022-03-29 DIAGNOSIS — K449 Diaphragmatic hernia without obstruction or gangrene: Secondary | ICD-10-CM | POA: Diagnosis not present

## 2022-03-29 DIAGNOSIS — K635 Polyp of colon: Secondary | ICD-10-CM | POA: Diagnosis not present

## 2022-03-29 DIAGNOSIS — K2270 Barrett's esophagus without dysplasia: Secondary | ICD-10-CM | POA: Diagnosis not present

## 2022-09-06 IMAGING — MR MR SHOULDER*L* W/O CM
4 of 5 series · 30 of 40 positions shown · non-contrast
Comparison: None.

CLINICAL DATA: Chronic left shoulder pain and limited range of
motion. History of left shoulder surgery approximately 5 years ago.

EXAM:
MRI OF THE LEFT SHOULDER WITHOUT CONTRAST
TECHNIQUE: Multiplanar, multisequence MR imaging of the shoulder was performed.
No intravenous contrast was administered.

[Series 5: T2 fat-sat · axial · left · 4.0mm · 0.44mm/px · z∈[-48,+72]mm · 8 of 26 slices shown (1 of 3)]
[im 1/26]
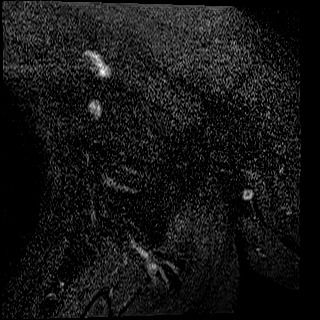
[im 4/26]
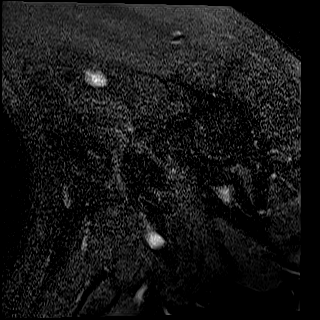
[im 8/26]
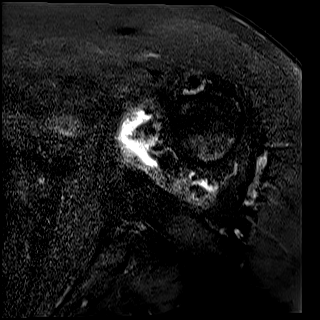
[im 11/26]
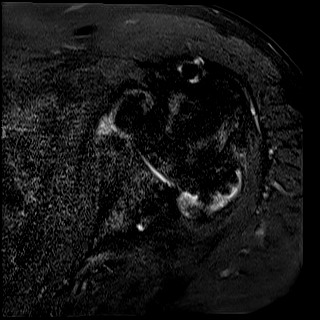
[im 15/26]
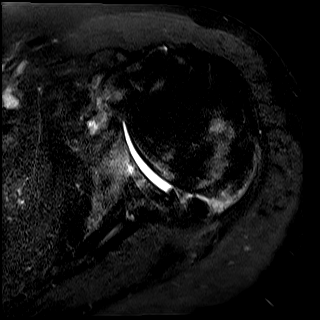
[im 18/26]
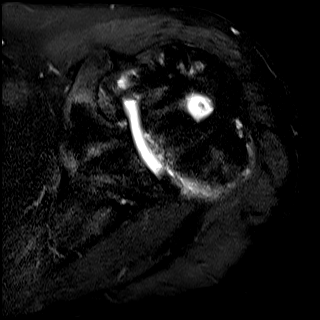
[im 22/26]
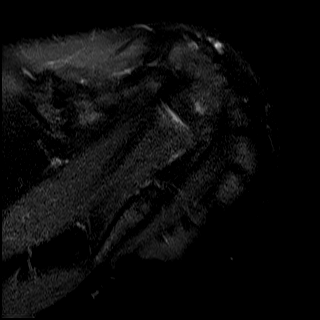
[im 26/26]
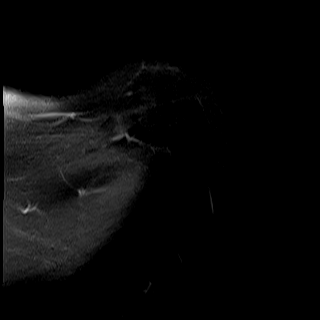

[Series 6: PD · oblique · left · 4.0mm · 0.44mm/px · 8 of 26 slices shown]
[im 1/26]
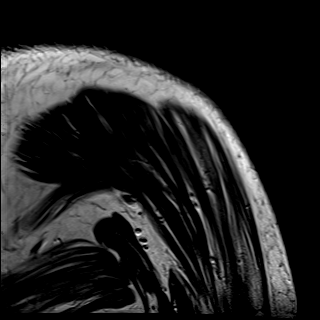
[im 4/26]
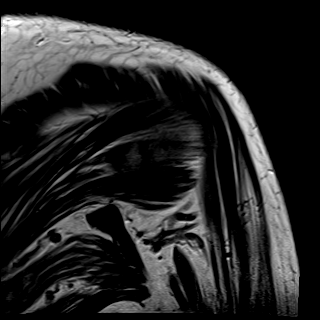
[im 8/26]
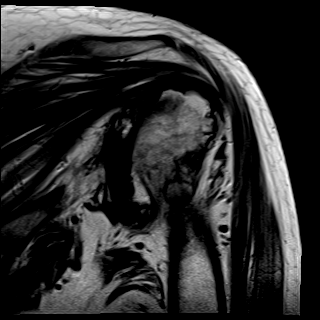
[im 11/26]
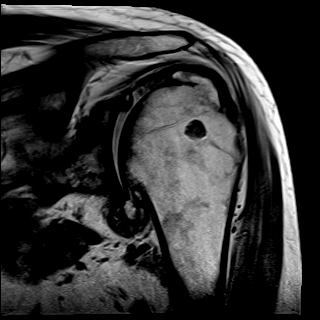
[im 15/26]
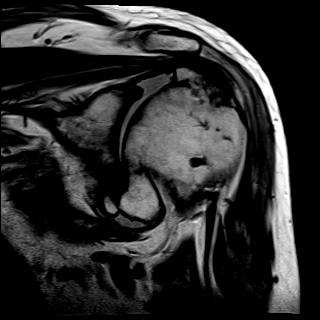
[im 18/26]
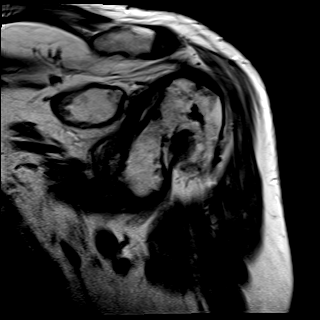
[im 22/26]
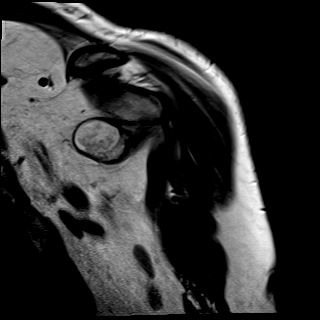
[im 26/26]
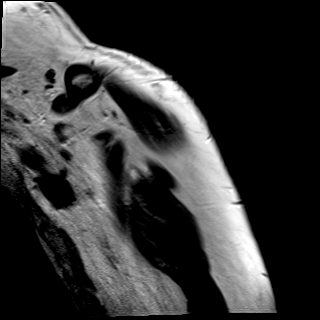

[Series 7: T2 fat-sat · oblique · left · 4.0mm · 0.44mm/px · 8 of 26 slices shown (2 of 3)]
[im 1/26]
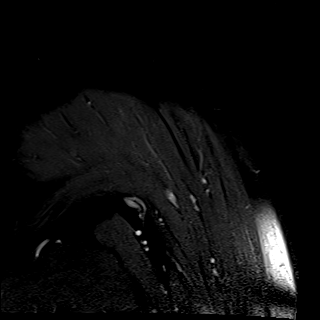
[im 4/26]
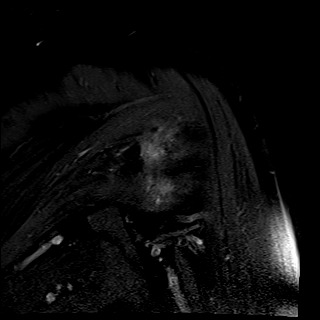
[im 8/26]
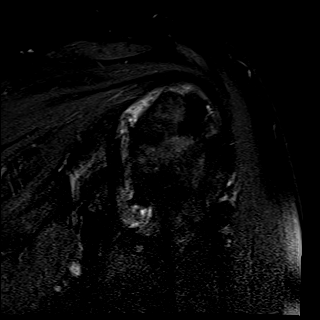
[im 11/26]
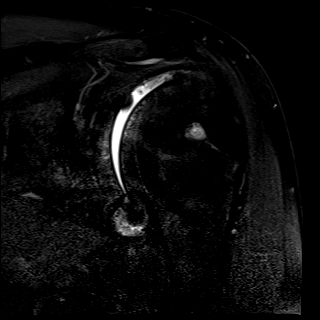
[im 15/26]
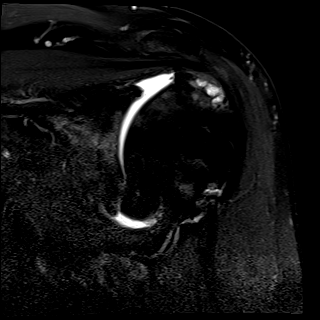
[im 18/26]
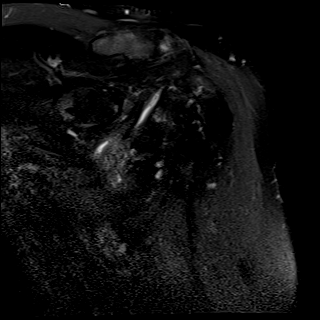
[im 22/26]
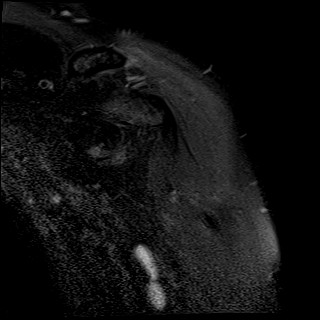
[im 26/26]
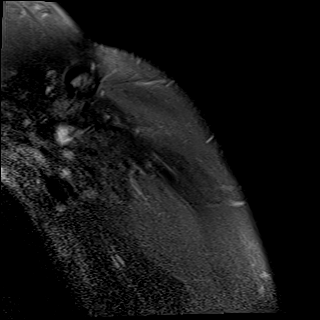

[Series 8: T2 fat-sat · oblique · left · 4.0mm · 0.22mm/px · 6 of 28 slices shown (3 of 3)]
[im 1/28]
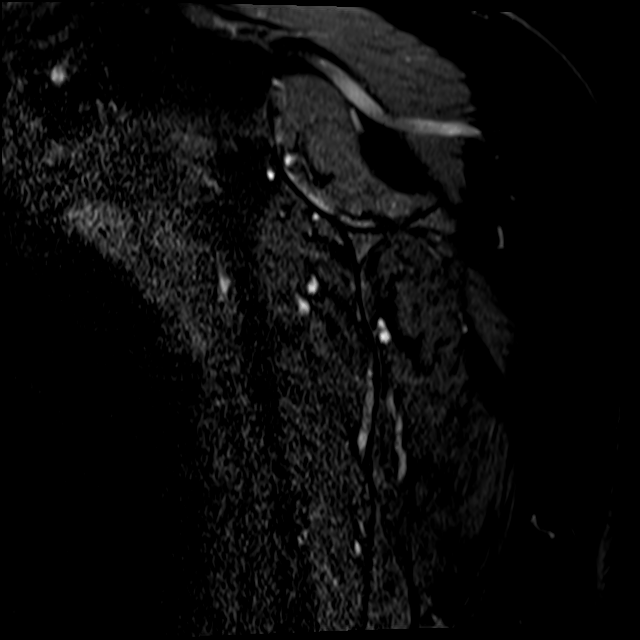
[im 4/28]
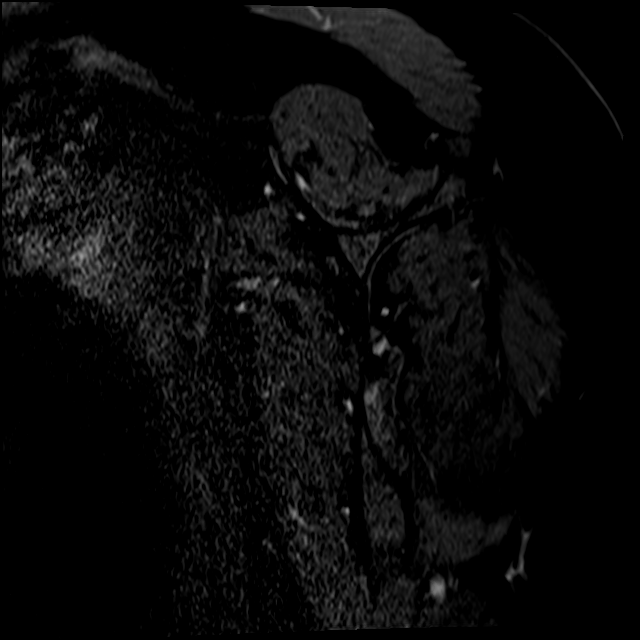
[im 8/28]
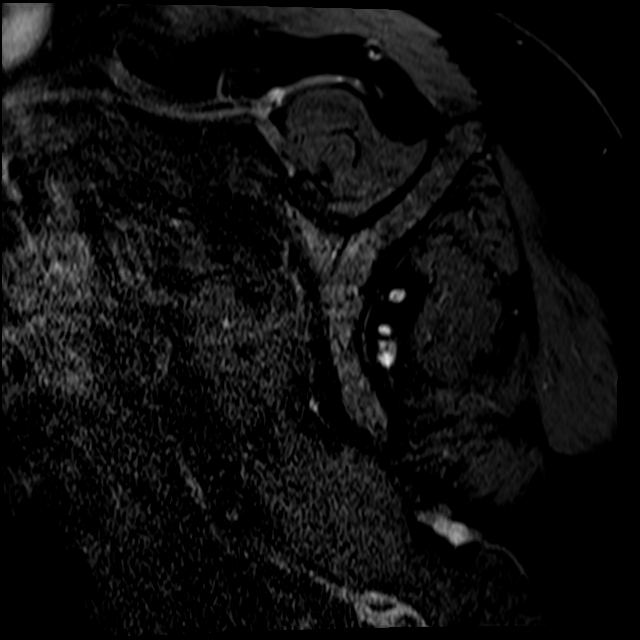
[im 12/28]
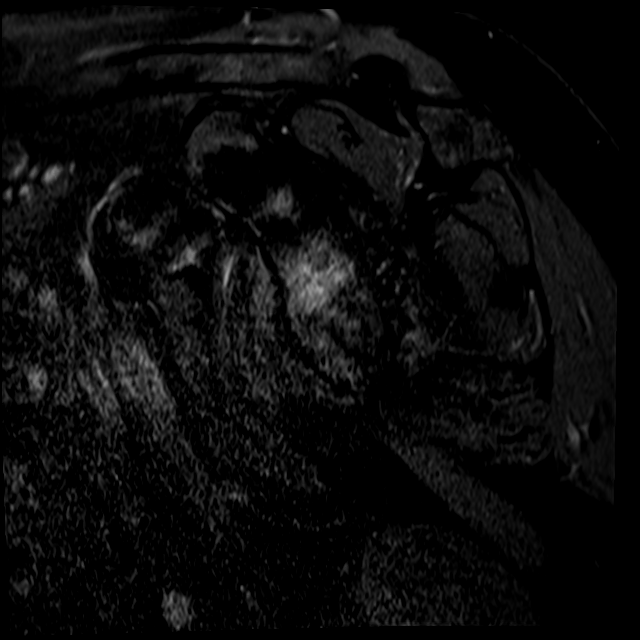
[im 16/28]
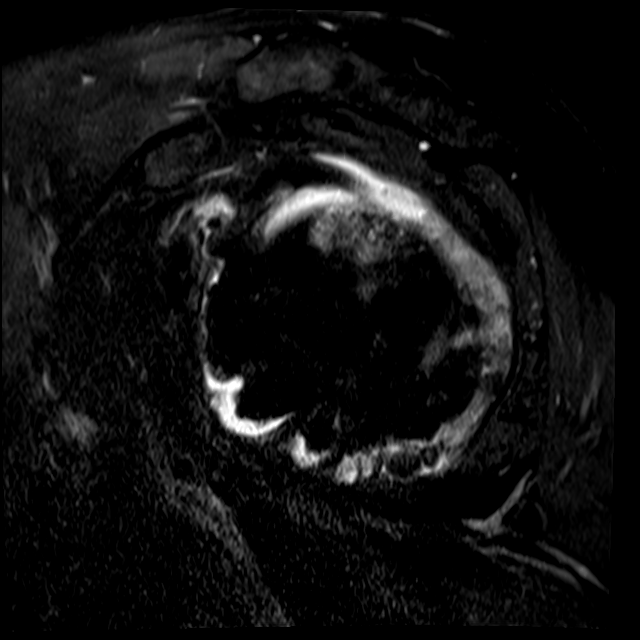
[im 24/28]
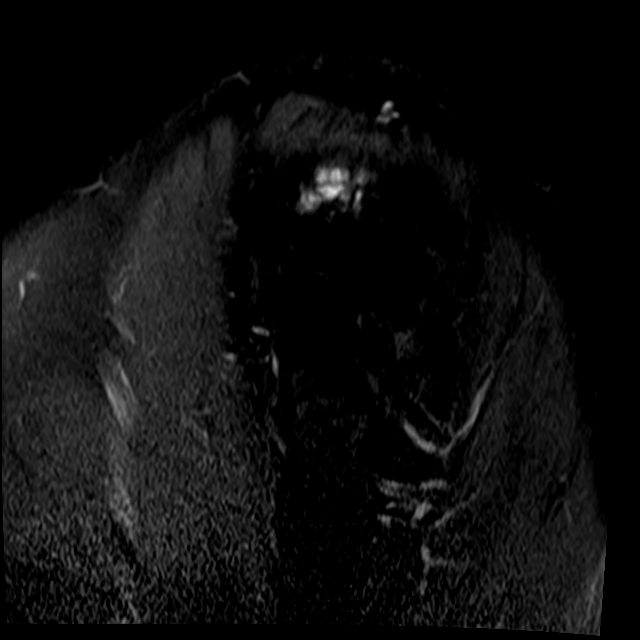

[30 of 40 positions shown; findings below may reference images not displayed]

FINDINGS: Rotator cuff: Intact. Mild appearing supraspinatus and infraspinatus
tendinopathy noted.

Muscles: There is mild to moderate atrophy all imaged musculature.
No focal lesion.

Biceps long head: Status post biceps tenodesis without evidence of
complication.

Acromioclavicular Joint: Normal. Type 1 acromion. No
subacromial/subdeltoid bursal fluid.

Glenohumeral Joint: The patient has severe, markedly advanced for
age glenohumeral osteoarthritis which is much worse than on the
prior study. Cartilage is completely denuded and there is very bulky
osteophytosis off the humeral head. Mild subchondral edema is
present in the glenoid. No notable subchondral cyst formation.

Labrum: The anterior and inferior labrum are not seen consistent
with degenerative maceration.

Bones: There is some artifact in the humeral head consistent with
prior rotator cuff repair. There is new exophytic bone medial to the
greater tuberosity measuring 2.1 cm by 0.6 cm craniocaudal
transverse and extending across the entire AP diameter of the
humerus. No fracture or worrisome lesion is identified.

Other: None.
IMPRESSION: Severe, markedly advanced for age glenohumeral osteoarthritis is
much worse than on the prior exam.

Status post rotator cuff repair. The cuff is intact. New exophytic
bone just medial to the greater tuberosity results in some mass
effect on the cuff may be posttraumatic or postsurgical.

Mild to moderate atrophy of all imaged musculature about the
shoulder is presumably secondary to disuse.

## 2022-09-26 DIAGNOSIS — Z125 Encounter for screening for malignant neoplasm of prostate: Secondary | ICD-10-CM | POA: Diagnosis not present

## 2022-09-26 DIAGNOSIS — Z0000 Encounter for general adult medical examination without abnormal findings: Secondary | ICD-10-CM | POA: Diagnosis not present

## 2022-09-26 DIAGNOSIS — E7800 Pure hypercholesterolemia, unspecified: Secondary | ICD-10-CM | POA: Diagnosis not present

## 2022-09-26 DIAGNOSIS — I10 Essential (primary) hypertension: Secondary | ICD-10-CM | POA: Diagnosis not present

## 2022-09-26 DIAGNOSIS — E119 Type 2 diabetes mellitus without complications: Secondary | ICD-10-CM | POA: Diagnosis not present

## 2022-09-26 DIAGNOSIS — Z1331 Encounter for screening for depression: Secondary | ICD-10-CM | POA: Diagnosis not present

## 2022-10-03 DIAGNOSIS — D649 Anemia, unspecified: Secondary | ICD-10-CM | POA: Diagnosis not present

## 2022-10-03 DIAGNOSIS — E538 Deficiency of other specified B group vitamins: Secondary | ICD-10-CM | POA: Diagnosis not present

## 2022-10-05 IMAGING — DX DG SHOULDER 1V*L*
2 series · 2 of 2 positions shown · non-contrast
Comparison: None.

CLINICAL DATA: Postop.

EXAM:
LEFT SHOULDER

[shoulder ap]
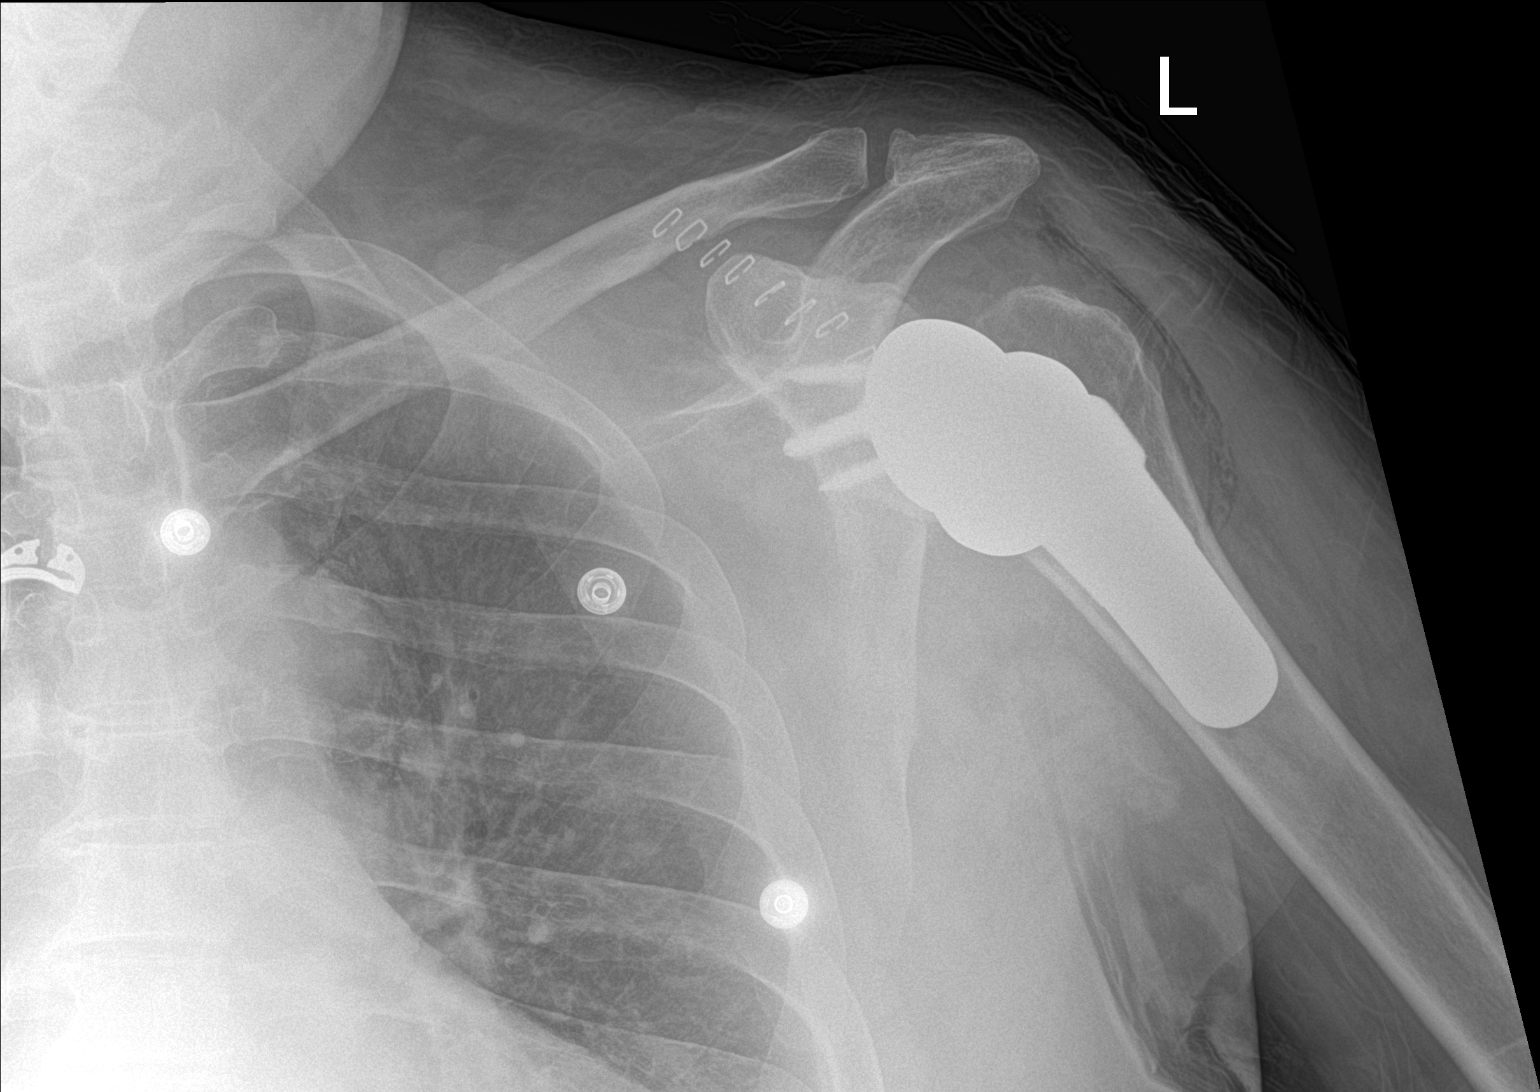

[shoulder obl]
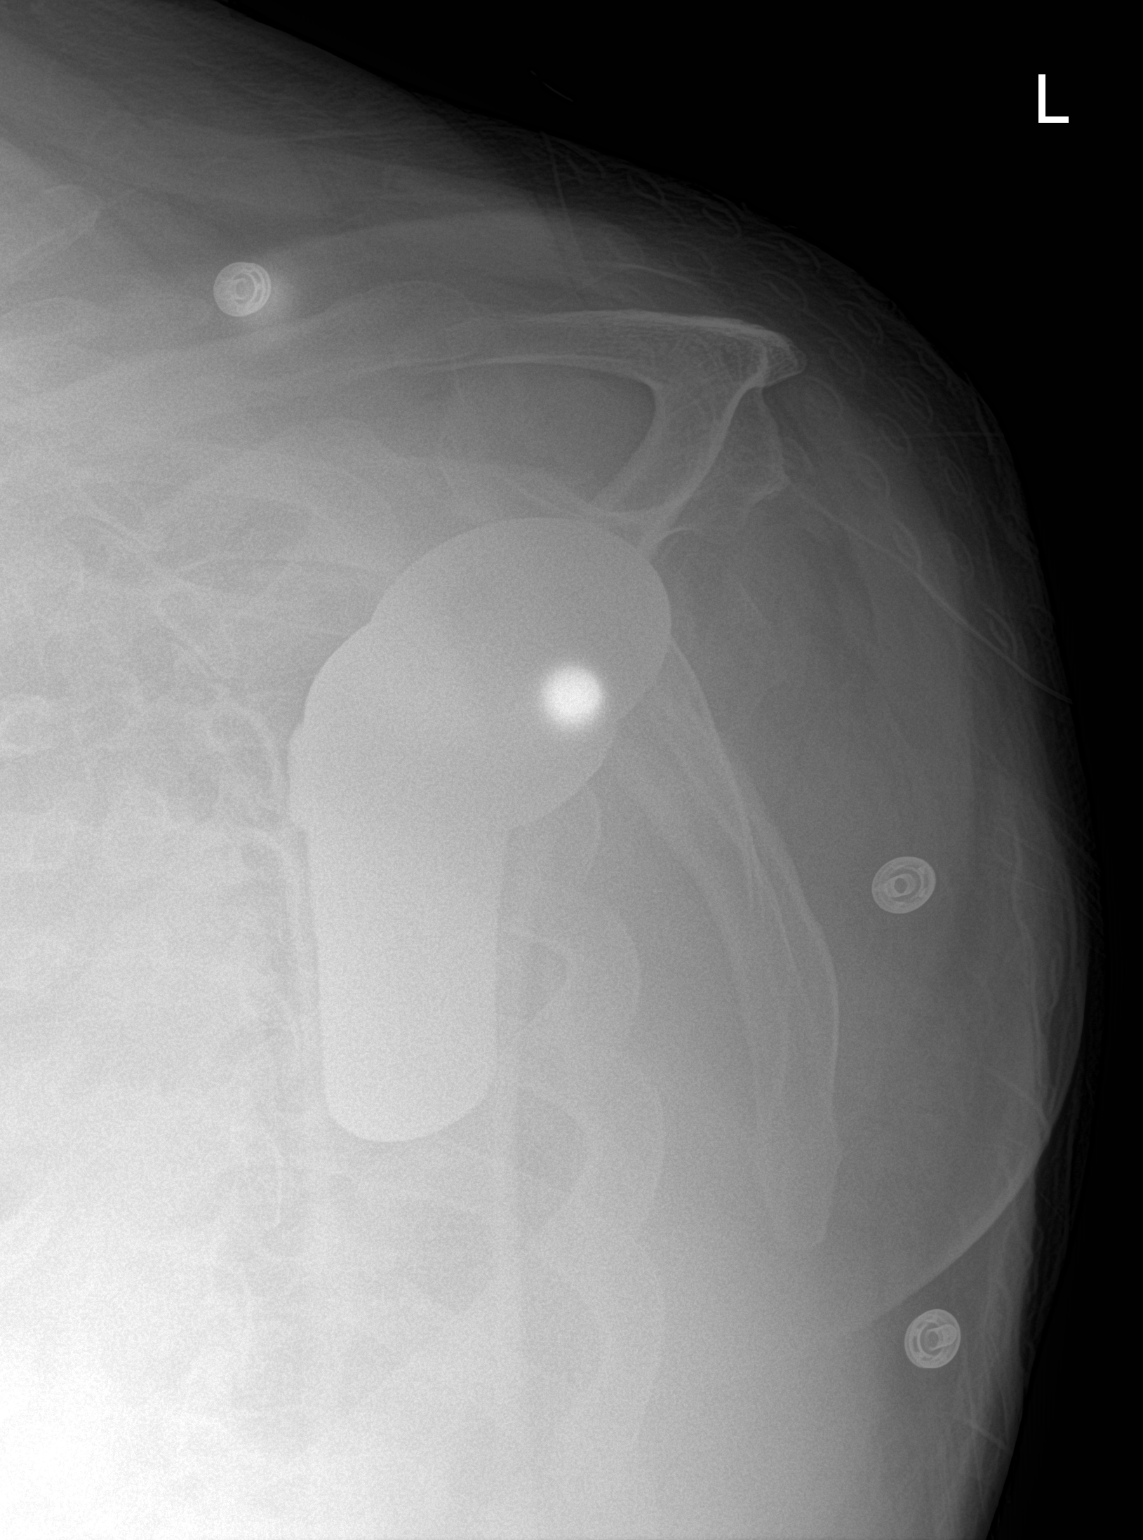

[2 of 2 positions shown; findings below may reference images not displayed]

FINDINGS: Reverse left shoulder arthroplasty in expected alignment. There is
no periprosthetic lucency or fracture. Recent postsurgical change
includes air and edema in the soft tissues with skin staples in
place.
IMPRESSION: Reverse left shoulder arthroplasty without immediate postoperative
complication.

## 2022-10-10 DIAGNOSIS — E538 Deficiency of other specified B group vitamins: Secondary | ICD-10-CM | POA: Diagnosis not present

## 2022-10-17 DIAGNOSIS — E538 Deficiency of other specified B group vitamins: Secondary | ICD-10-CM | POA: Diagnosis not present

## 2022-10-24 DIAGNOSIS — E538 Deficiency of other specified B group vitamins: Secondary | ICD-10-CM | POA: Diagnosis not present

## 2022-10-31 DIAGNOSIS — E538 Deficiency of other specified B group vitamins: Secondary | ICD-10-CM | POA: Diagnosis not present

## 2022-11-14 ENCOUNTER — Ambulatory Visit: Payer: Disability Insurance | Admitting: Dietician

## 2023-01-31 DIAGNOSIS — E538 Deficiency of other specified B group vitamins: Secondary | ICD-10-CM | POA: Diagnosis not present

## 2023-01-31 DIAGNOSIS — I10 Essential (primary) hypertension: Secondary | ICD-10-CM | POA: Diagnosis not present

## 2023-01-31 DIAGNOSIS — E119 Type 2 diabetes mellitus without complications: Secondary | ICD-10-CM | POA: Diagnosis not present

## 2023-01-31 DIAGNOSIS — D649 Anemia, unspecified: Secondary | ICD-10-CM | POA: Diagnosis not present

## 2023-02-24 ENCOUNTER — Ambulatory Visit: Payer: BC Managed Care – PPO | Admitting: Nurse Practitioner

## 2023-02-24 ENCOUNTER — Encounter: Payer: Self-pay | Admitting: Nurse Practitioner

## 2023-02-24 ENCOUNTER — Telehealth: Payer: Self-pay | Admitting: Nurse Practitioner

## 2023-02-24 VITALS — BP 117/63 | HR 56 | Temp 98.7°F | Resp 16 | Ht 71.06 in | Wt 248.4 lb

## 2023-02-24 DIAGNOSIS — E559 Vitamin D deficiency, unspecified: Secondary | ICD-10-CM | POA: Diagnosis not present

## 2023-02-24 DIAGNOSIS — Z7689 Persons encountering health services in other specified circumstances: Secondary | ICD-10-CM

## 2023-02-24 DIAGNOSIS — K2270 Barrett's esophagus without dysplasia: Secondary | ICD-10-CM | POA: Insufficient documentation

## 2023-02-24 DIAGNOSIS — Z1159 Encounter for screening for other viral diseases: Secondary | ICD-10-CM | POA: Diagnosis not present

## 2023-02-24 DIAGNOSIS — I10 Essential (primary) hypertension: Secondary | ICD-10-CM | POA: Insufficient documentation

## 2023-02-24 DIAGNOSIS — K22719 Barrett's esophagus with dysplasia, unspecified: Secondary | ICD-10-CM

## 2023-02-24 DIAGNOSIS — E7800 Pure hypercholesterolemia, unspecified: Secondary | ICD-10-CM

## 2023-02-24 DIAGNOSIS — Z114 Encounter for screening for human immunodeficiency virus [HIV]: Secondary | ICD-10-CM

## 2023-02-24 DIAGNOSIS — E1169 Type 2 diabetes mellitus with other specified complication: Secondary | ICD-10-CM

## 2023-02-24 DIAGNOSIS — Z7985 Long-term (current) use of injectable non-insulin antidiabetic drugs: Secondary | ICD-10-CM | POA: Insufficient documentation

## 2023-02-24 DIAGNOSIS — E119 Type 2 diabetes mellitus without complications: Secondary | ICD-10-CM | POA: Diagnosis not present

## 2023-02-24 LAB — MICROALBUMIN, URINE WAIVED
Creatinine, Urine Waived: 50 mg/dL (ref 10–300)
Microalb, Ur Waived: 10 mg/L (ref 0–19)

## 2023-02-24 MED ORDER — LISINOPRIL 10 MG PO TABS: 10.0000 mg | ORAL_TABLET | Freq: Every day | ORAL | 1 refills | Status: DC

## 2023-02-24 MED ORDER — TIRZEPATIDE 5 MG/0.5ML ~~LOC~~ SOAJ: 5.0000 mg | SUBCUTANEOUS | 1 refills | Status: DC

## 2023-02-24 MED ORDER — ATORVASTATIN CALCIUM 40 MG PO TABS: 40.0000 mg | ORAL_TABLET | Freq: Every day | ORAL | Status: DC

## 2023-02-24 MED ORDER — SILDENAFIL CITRATE 100 MG PO TABS: 50.0000 mg | ORAL_TABLET | Freq: Every day | ORAL | 4 refills | Status: DC | PRN

## 2023-02-24 NOTE — Telephone Encounter (Signed)
Copied from CRM [id number]. Topic: Appointment Scheduling - Scheduling Inquiry for Clinic >> Feb 24, 2023 12:59 PM Lee Lucas wrote: Reason for CRM: The patient would like to discuss changing their upcoming appointment to virtual   Please contact the patient further when possible

## 2023-02-24 NOTE — Assessment & Plan Note (Signed)
Chronic.  Well controlled.  Has been out of Mounjaro. Will increase the dose to 5mg .  Continue with Metformin 1000mg  daily.  May discontinue Actos if A1c is less than 7%.  Labs ordered today.  Follow up in 1 month.  Call sooner if concerns arise.

## 2023-02-24 NOTE — Assessment & Plan Note (Signed)
Labs ordered at visit today.  Will make recommendations based on lab results.

## 2023-02-24 NOTE — Assessment & Plan Note (Signed)
Recommended eating smaller high protein, low fat meals more frequently and exercising 30 mins a day 5 times a week with a goal of 10-15lb weight loss in the next 3 months.

## 2023-02-24 NOTE — Patient Instructions (Signed)
[facility]

## 2023-02-24 NOTE — Assessment & Plan Note (Signed)
Chronic.  Controlled.  Continue with current medication regimen of Atorvastatin 40mg .  Labs ordered today.  Return to clinic in 6 months for reevaluation.  Call sooner if concerns arise.

## 2023-02-24 NOTE — Assessment & Plan Note (Signed)
New referral placed for patient to follow up with GI.

## 2023-02-24 NOTE — Progress Notes (Signed)
BP 117/63 (BP Location: Right Arm, Patient Position: Sitting, Cuff Size: Large)   Pulse (!) 56   Temp 98.7 F (37.1 C) (Oral)   Resp 16   Ht 5' 11.06" (1.805 m)   Wt 248 lb 6.4 oz (112.7 kg)   SpO2 98%   BMI 34.58 kg/m    Subjective:    Patient ID: Lee Lucas, male    DOB: November 24, 1960, 63 y.o.   MRN: [id number]  HPI: Lee Lucas is a 63 y.o. male  Chief Complaint  Patient presents with  . Establish Care   Patient presents to clinic to establish care with new PCP.  Introduced to Publishing rights manager role and practice setting.  All questions answered.  Discussed provider/patient relationship and expectations.  Patient reports a history of htn, hld, dm 2, barretts esophagus.   Patient denies a history of: Thyroid problems, Depression, Anxiety, Neurological problems, and Abdominal problems.   DIABETES Mounjaro- 2.5mg - has not gotten the 5mg  dose.  Metformin 1000mg  Daily.  Hypoglycemic episodes:no Polydipsia/polyuria: no Visual disturbance: no Chest pain: no Paresthesias: no Glucose Monitoring: no  Accucheck frequency: Not Checking  Fasting glucose:  Post prandial:  Evening:  Before meals: Taking Insulin?: no  Long acting insulin:  Short acting insulin: Blood Pressure Monitoring: not checking Retinal Examination: Not up to Date Foot Exam: Up to Date Diabetic Education: Not Completed Pneumovax: Not up to Date Influenza: Not up to Date Aspirin: yes  HYPERTENSION without Chronic Kidney Disease Hypertension status: controlled  Satisfied with current treatment? yes Duration of hypertension: years BP monitoring frequency:  not checking BP range:  BP medication side effects:  no Medication compliance: excellent compliance Previous BP meds:lisinopril Aspirin: no Recurrent headaches: no Visual changes: no Palpitations: no Dyspnea: no Chest pain: no Lower extremity edema: no Dizzy/lightheaded: no  Patient stopped smoking cigarettes 25 years ago.     Patient states he has trouble with ED.  Would like something to help with the symptoms.    Active Ambulatory Problems    Diagnosis Date Noted  . 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 Type 2 diabetes mellitus (HCC) 02/24/2023  . Hypertension 02/24/2023  . Morbid obesity due to excess calories (HCC) 12/18/2013  . Pure hypercholesterolemia 02/06/2017  . Barrett's esophagus 02/24/2023  . Diabetes mellitus treated with injections of non-insulin medication (HCC) 02/24/2023  . Vitamin D deficiency 02/24/2023   Resolved Ambulatory Problems    Diagnosis Date Noted  . No Resolved Ambulatory Problems   Past Medical History:  Diagnosis Date  . Arthritis   . Cancer (HCC) 01/10/1993  . Diabetes mellitus without complication (HCC)   . GERD (gastroesophageal reflux disease)   . HBP (high blood pressure)   . Reflux    Past Surgical History:  Procedure Laterality Date  . BICEPT TENODESIS  08/27/2020   Procedure: BICEPS TENODESIS;  Surgeon: Christena Flake, MD;  Location: [facility];  Service: Orthopedics;;  . COLONOSCOPY WITH ESOPHAGOGASTRODUODENOSCOPY (EGD)  08/05/13  . FOOT SURGERY Left 7.22.14  . REVERSE SHOULDER ARTHROPLASTY Left 08/27/2020   Procedure: REVERSE SHOULDER ARTHROPLASTY;  Surgeon: Christena Flake, MD;  Location: [facility];  Service: Orthopedics;  Laterality: Left;  . SHOULDER ARTHROSCOPY Left 01/28/2015   Procedure: ARTHROSCOPY SHOULDER WITH DEBRIDEMENT DECOMPRESSION AND MANIPULATION UNDER ANESTHESIA;  Surgeon: Christena Flake, MD;  Location: [facility];  Service: Orthopedics;  Laterality: Left;  Diabetic - oral meds  . 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 Heart murmur Mother   . Heart defect Brother      Review of Systems  Eyes:  Negative for visual disturbance.  Respiratory:  Negative for chest tightness and shortness of breath.   Cardiovascular:  Negative for chest pain,  palpitations and leg swelling.  Endocrine: Negative for polydipsia and polyuria.  Neurological:  Negative for dizziness, light-headedness, numbness and headaches.    Per HPI unless specifically indicated above     Objective:    BP 117/63 (BP Location: Right Arm, Patient Position: Sitting, Cuff Size: Large)   Pulse (!) 56   Temp 98.7 F (37.1 C) (Oral)   Resp 16   Ht 5' 11.06" (1.805 m)   Wt 248 lb 6.4 oz (112.7 kg)   SpO2 98%   BMI 34.58 kg/m   Wt Readings from Last 3 Encounters:  02/24/23 248 lb 6.4 oz (112.7 kg)  08/27/20 255 lb 1.2 oz (115.7 kg)  08/19/20 255 lb (115.7 kg)    Physical Exam Vitals and nursing note reviewed.  Constitutional:      General: He is not in acute distress.    Appearance: Normal appearance. He is obese. He is not ill-appearing, toxic-appearing or diaphoretic.  HENT:     Head: Normocephalic.     Right Ear: External ear normal.     Left Ear: External ear normal.     Nose: Nose normal. No congestion or rhinorrhea.     Mouth/Throat:     Mouth: Mucous membranes are moist.  Eyes:     General:        Right eye: No discharge.        Left eye: No discharge.     Extraocular Movements: Extraocular movements intact.     Conjunctiva/sclera: Conjunctivae normal.     Pupils: Pupils are equal, round, and reactive to light.  Cardiovascular:     Rate and Rhythm: Normal rate and regular rhythm.     Heart sounds: No murmur heard. Pulmonary:     Effort: Pulmonary effort is normal. No respiratory distress.     Breath sounds: Normal breath sounds. No wheezing, rhonchi or rales.  Abdominal:     General: Abdomen is flat. Bowel sounds are normal.  Musculoskeletal:     Cervical back: Normal range of motion and neck supple.  Skin:    General: Skin is warm and dry.     Capillary Refill: Capillary refill takes less than 2 seconds.  Neurological:     General: No focal deficit present.     Mental Status: He is alert and oriented to person, place, and time.   Psychiatric:        Mood and Affect: Mood normal.        Behavior: Behavior normal.        Thought Content: Thought content normal.        Judgment: Judgment normal.    Results for orders placed or performed during the hospital encounter of 08/27/20  Glucose, capillary   Collection Time: 08/27/20  8:24 AM  Result Value Ref Range   Glucose-Capillary 111 (H) 70 - 99 mg/dL  ABO/Rh   Collection Time: 08/27/20  8:25 AM  Result Value Ref Range   ABO/RH(D)      A NEG Performed at [facility], [address], [place], [place] [address]   Surgical pathology   Collection Time: 08/27/20 12:04 PM  Result Value Ref Range   SURGICAL PATHOLOGY  SURGICAL PATHOLOGY CASE: [id number] PATIENT: Nkosi Emanuele Surgical Pathology Report     Specimen Submitted: A. 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 HUMERAL HEAD, LEFT; ARTHROPLASTY: - DEGENERATIVE JOINT DISEASE. - NEGATIVE FOR MALIGNANCY.  GROSS DESCRIPTION: A. Labeled: Left humeral head Received: Formalin Collection time: 12:04 PM on 08/27/2020 Placed into formalin time: 1:40 PM on 08/27/2020 Size of specimen:      Head -7.6 x 5.9 x 1.9 cm      Neck -None grossly identified.      Additional tissue: None grossly identified. Articular surface: The articular surface has a central 5 x 4.1 cm area of pink-tan eburnation with central pitting and a peripheral rim of lobulated tan-purple thin cartilage. Cut surface: The cut surface is tan-yellow and firm with denser areas underlying the eburnation. Other findings: None grossly identified.  Block summar y: 1 - representative sections of eburnation to adjacent lobulated cartilage  Tissue decalcification: Cassette 1  RB 08/28/2020  Final Diagnosis performed by Katherine Mantle, MD.   Electronically signed 08/31/2020 9:35:32AM The electronic signature indicates that  the named Attending Pathologist has evaluated the specimen Technical component performed at [facility], [address] [place], [place] [address] Lab: [phone] Dir: Jolene Schimke, MD, MMM  Professional component performed at [facility], [facility], [address] [place], [place], [place] [address] Lab: [phone] Dir: Georgiann Cocker. Rubinas, MD   Glucose, capillary   Collection Time: 08/27/20  2:26 PM  Result Value Ref Range   Glucose-Capillary 157 (H) 70 - 99 mg/dL  Glucose, capillary   Collection Time: 08/27/20  4:32 PM  Result Value Ref Range   Glucose-Capillary 179 (H) 70 - 99 mg/dL  Creatinine, serum   Collection Time: 08/27/20  5:00 PM  Result Value Ref Range   Creatinine, Ser 0.67 0.61 - 1.24 mg/dL   GFR, Estimated >29 >51 mL/min  Glucose, capillary   Collection Time: 08/27/20 10:01 PM  Result Value Ref Range   Glucose-Capillary 142 (H) 70 - 99 mg/dL   Comment 1 Notify RN   Basic metabolic panel   Collection Time: 08/28/20  3:02 AM  Result Value Ref Range   Sodium 138 135 - 145 mmol/L   Potassium 4.7 3.5 - 5.1 mmol/L   Chloride 106 98 - 111 mmol/L   CO2 28 22 - 32 mmol/L   Glucose, Bld 133 (H) 70 - 99 mg/dL   BUN 12 6 - 20 mg/dL   Creatinine, Ser 8.84 0.61 - 1.24 mg/dL   Calcium 8.3 (L) 8.9 - 10.3 mg/dL   GFR, Estimated >16 >60 mL/min   Anion gap 4 (L) 5 - 15  CBC   Collection Time: 08/28/20  3:02 AM  Result Value Ref Range   WBC 14.6 (H) 4.0 - 10.5 K/uL   RBC 3.72 (L) 4.22 - 5.81 MIL/uL   Hemoglobin 12.7 (L) 13.0 - 17.0 g/dL   HCT 63.0 (L) 16.0 - 10.9 %   MCV 101.6 (H) 80.0 - 100.0 fL   MCH 34.1 (H) 26.0 - 34.0 pg   MCHC 33.6 30.0 - 36.0 g/dL   RDW 32.3 55.7 - 32.2 %   Platelets 209 150 - 400 K/uL   nRBC 0.0 0.0 - 0.2 %  Glucose, capillary   Collection Time: 08/28/20  8:02 AM  Result Value Ref Range   Glucose-Capillary 124 (H) 70 - 99 mg/dL  Glucose, capillary   Collection Time: 08/28/20 11:52 AM  Result Value Ref Range   Glucose-Capillary 119  (H) 70 - 99 mg/dL      Assessment & Plan:   Problem List Items Addressed This Visit       Cardiovascular and Mediastinum   Hypertension   Chronic.  Controlled.  Continue with current medication regimen of Lisinopril 10mg .  Will discontinue HCTZ.  Labs ordered today.  Return to clinic in 6 months for reevaluation.  Call sooner if concerns arise.        Relevant Medications   atorvastatin (LIPITOR) 40 MG tablet   lisinopril (ZESTRIL) 10 MG tablet   sildenafil (VIAGRA) 100 MG tablet     Digestive   Barrett's esophagus   New referral placed for patient to follow up with GI.       Relevant Orders   Ambulatory referral to Gastroenterology     Endocrine   Type 2 diabetes mellitus (HCC) - Primary   Chronic.  Well controlled.  Has been out of Mounjaro. Will increase the dose to 5mg .  Continue with Metformin 1000mg  daily.  May discontinue Actos if A1c is less than 7%.  Labs ordered today.  Follow up in 1 month.  Call sooner if concerns arise.       Relevant Medications   tirzepatide (MOUNJARO) 5 MG/0.5ML Pen   atorvastatin (LIPITOR) 40 MG tablet   lisinopril (ZESTRIL) 10 MG tablet   Other Relevant Orders   Hemoglobin A1c   Comprehensive metabolic panel   Microalbumin, Urine Waived   Diabetes mellitus treated with injections of non-insulin medication (HCC)   Relevant Medications   tirzepatide (MOUNJARO) 5 MG/0.5ML Pen   atorvastatin (LIPITOR) 40 MG tablet   lisinopril (ZESTRIL) 10 MG tablet     Other   Morbid obesity due to excess calories (HCC)   Recommended eating smaller high protein, low fat meals more frequently and exercising 30 mins a day 5 times a week with a goal of 10-15lb weight loss in the next 3 months.       Relevant Medications   tirzepatide (MOUNJARO) 5 MG/0.5ML Pen   Pure hypercholesterolemia   Chronic.  Controlled.  Continue with current medication regimen of Atorvastatin 40mg .  Labs ordered today.  Return to clinic in 6 months for reevaluation.  Call  sooner if concerns arise.        Relevant Medications   atorvastatin (LIPITOR) 40 MG tablet   lisinopril (ZESTRIL) 10 MG tablet   sildenafil (VIAGRA) 100 MG tablet   Other Relevant Orders   Lipid panel   Vitamin D deficiency   Labs ordered at visit today.  Will make recommendations based on lab results.        Relevant Orders   Vitamin D (25 hydroxy)   Other Visit Diagnoses       Screening for HIV (human immunodeficiency virus)       Relevant Orders   HIV Antibody (routine testing w rflx)     Encounter for hepatitis C screening test for low risk patient       Relevant Orders   Hepatitis C antibody     Encounter to establish care            Follow up plan: Return in about 1 month (around 03/24/2023) for Physical and Fasting labs.

## 2023-02-24 NOTE — Assessment & Plan Note (Signed)
Chronic.  Controlled.  Continue with current medication regimen of Lisinopril 10mg .  Will discontinue HCTZ.  Labs ordered today.  Return to clinic in 6 months for reevaluation.  Call sooner if concerns arise.

## 2023-02-25 LAB — LIPID PANEL
Chol/HDL Ratio: 2.6 {ratio} (ref 0.0–5.0)
Cholesterol, Total: 125 mg/dL (ref 100–199)
HDL: 48 mg/dL (ref >39)
LDL Chol Calc (NIH): 66 mg/dL (ref 0–99)
Triglycerides: 48 mg/dL (ref 0–149)
VLDL Cholesterol Cal: 11 mg/dL (ref 5–40)

## 2023-02-25 LAB — COMPREHENSIVE METABOLIC PANEL
ALT: 11 [IU]/L (ref 0–44)
AST: 10 [IU]/L (ref 0–40)
Albumin: 4.1 g/dL (ref 3.9–4.9)
Alkaline Phosphatase: 115 [IU]/L (ref 44–121)
BUN/Creatinine Ratio: 9 — ABNORMAL LOW (ref 10–24)
BUN: 7 mg/dL — ABNORMAL LOW (ref 8–27)
Bilirubin Total: 0.4 mg/dL (ref 0.0–1.2)
CO2: 27 mmol/L (ref 20–29)
Calcium: 9.4 mg/dL (ref 8.6–10.2)
Chloride: 97 mmol/L (ref 96–106)
Creatinine, Ser: 0.74 mg/dL — ABNORMAL LOW (ref 0.76–1.27)
Globulin, Total: 2.3 g/dL (ref 1.5–4.5)
Glucose: 97 mg/dL (ref 70–99)
Potassium: 4.3 mmol/L (ref 3.5–5.2)
Sodium: 138 mmol/L (ref 134–144)
Total Protein: 6.4 g/dL (ref 6.0–8.5)
eGFR: 103 mL/min/{1.73_m2} (ref >59)

## 2023-02-25 LAB — HEMOGLOBIN A1C
Est. average glucose Bld gHb Est-mCnc: 128 mg/dL
Hgb A1c MFr Bld: 6.1 % — ABNORMAL HIGH (ref 4.8–5.6)

## 2023-02-25 LAB — VITAMIN D 25 HYDROXY (VIT D DEFICIENCY, FRACTURES): Vit D, 25-Hydroxy: 69.7 ng/mL (ref 30.0–100.0)

## 2023-02-25 LAB — HIV ANTIBODY (ROUTINE TESTING W REFLEX): HIV Screen 4th Generation wRfx: NONREACTIVE

## 2023-02-25 LAB — HEPATITIS C ANTIBODY: Hep C Virus Ab: NONREACTIVE

## 2023-03-01 NOTE — Telephone Encounter (Signed)
Called patient to inform we can not do a virtual physical. Patient agreed and understands that his appt will be in person.

## 2023-03-27 NOTE — Progress Notes (Unsigned)
 There were no vitals taken for this visit.   Subjective:    Patient ID: Lee Lucas, male    DOB: 09-Apr-1960, 63 y.o.   MRN: [id number]  HPI: Lee Lucas is a 63 y.o. male presenting on 03/28/2023 for comprehensive medical examination. Current medical complaints include:{Blank single:19197::"none","***"}  He currently lives with: Interim Problems from his last visit: {Blank single:19197::"yes","no"}  DIABETES Mounjaro- 2.5mg - has not gotten the 5mg  dose.  Metformin 1000mg  Daily.  Hypoglycemic episodes:no Polydipsia/polyuria: no Visual disturbance: no Chest pain: no Paresthesias: no Glucose Monitoring: no  Accucheck frequency: Not Checking  Fasting glucose:  Post prandial:  Evening:  Before meals: Taking Insulin?: no  Long acting insulin:  Short acting insulin: Blood Pressure Monitoring: not checking Retinal Examination: Not up to Date Foot Exam: Up to Date Diabetic Education: Not Completed Pneumovax: Not up to Date Influenza: Not up to Date Aspirin: yes  HYPERTENSION without Chronic Kidney Disease Hypertension status: controlled  Satisfied with current treatment? yes Duration of hypertension: years BP monitoring frequency:  not checking BP range:  BP medication side effects:  no Medication compliance: excellent compliance Previous BP meds:lisinopril Aspirin: no Recurrent headaches: no Visual changes: no Palpitations: no Dyspnea: no Chest pain: no Lower extremity edema: no Dizzy/lightheaded: no  Depression Screen done today and results listed below:     02/24/2023    8:19 AM 07/28/2015    3:50 PM  Depression screen PHQ 2/9  Decreased Interest 1 0  Down, Depressed, Hopeless 0 0  PHQ - 2 Score 1 0    The patient {has/does not have:19849} a history of falls. I {did/did not:19850} complete a risk assessment for falls. A plan of care for falls {was/was not:19852} documented.   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   Diabetes mellitus without complication (HCC)    GERD (gastroesophageal reflux disease)    HBP (high blood pressure)    Reflux     Surgical History:  Past Surgical History:  Procedure Laterality Date   BICEPT TENODESIS  08/27/2020   Procedure: BICEPS TENODESIS;  Surgeon: Christena Flake, MD;  Location: [facility];  Service: Orthopedics;;   COLONOSCOPY WITH ESOPHAGOGASTRODUODENOSCOPY (EGD)  08/05/13   FOOT SURGERY Left 7.22.14   REVERSE SHOULDER ARTHROPLASTY Left 08/27/2020   Procedure: REVERSE SHOULDER ARTHROPLASTY;  Surgeon: Christena Flake, MD;  Location: [facility];  Service: Orthopedics;  Laterality: Left;   SHOULDER ARTHROSCOPY Left 01/28/2015   Procedure: ARTHROSCOPY SHOULDER WITH DEBRIDEMENT DECOMPRESSION AND MANIPULATION UNDER ANESTHESIA;  Surgeon: Christena Flake, MD;  Location: [facility];  Service: Orthopedics;  Laterality: Left;  Diabetic - oral meds   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   atorvastatin (LIPITOR) 40 MG tablet Take 1 tablet (40 mg total) by mouth at bedtime.   lisinopril (ZESTRIL) 10 MG tablet Take 1 tablet (10 mg total) by mouth daily.   metFORMIN (GLUCOPHAGE) 1000 MG tablet Take 1,000 mg by mouth daily with breakfast.   pioglitazone (ACTOS) 30 MG tablet Take 30 mg by mouth daily.    sildenafil (VIAGRA) 100 MG tablet Take 0.5-1 tablets (50-100 mg total) by  mouth daily as needed for erectile dysfunction.   tirzepatide [facility]) 5 MG/0.5ML Pen Inject 5 mg into the skin once a week.   vitamin B-12 (CYANOCOBALAMIN) 100 MCG tablet Take 100 mcg by mouth daily.   Vitamin D, Ergocalciferol, (DRISDOL) 1.25 MG (50000 UNIT) CAPS capsule Take 50,000 Units by mouth once a week.   No current  facility-administered medications on file prior to visit.    Allergies:  No Known Allergies  Social History:  Social History   Socioeconomic History   Marital status: Single    Spouse name: Not on file   Number of children: 1   Years of education: Not on file   Highest education level: Not on file  Occupational History   Not on file  Tobacco Use   Smoking status: Former    Current packs/day: 0.00    Average packs/day: 1.5 packs/day for 18.0 years (27.0 ttl pk-yrs)    Types: Cigars, Cigarettes    Start date: 02/11/1982    Quit date: 02/12/2000    Years since quitting: 23.1   Smokeless tobacco: Never  Vaping Use   Vaping status: Never Used  Substance and Sexual Activity   Alcohol use: Not Currently    Alcohol/week: 0.0 - 1.0 standard drinks of alcohol    Comment: sober   Drug use: Never   Sexual activity: Yes    Birth control/protection: None  Other Topics Concern   Not on file  Social History Narrative   Not on file   Social Drivers of Health   Financial Resource Strain: Low Risk  (05/24/2022)   Received from [facility]   Overall Financial Resource Strain (CARDIA)    Difficulty of Paying Living Expenses: Not hard at all  Food Insecurity: No Food Insecurity (05/24/2022)   Received [facility]   Hunger Vital Sign    Worried About Running Out of Food in the Last Year: Never true    Ran Out of Food in the Last Year: Never true  Transportation Needs: No Transportation Needs (05/24/2022)   Received from [facility]    In the past 12 months, has lack of transportation kept you from medical appointments or from getting medications?: No    Lack of Transportation (Non-Medical): No  Physical Activity: Not on file  Stress: Not on file  Social Connections: Not on file  Intimate Partner Violence: Not on file   Social History   Tobacco Use  Smoking Status Former   Current packs/day: 0.00    Average packs/day: 1.5 packs/day for 18.0 years (27.0 ttl pk-yrs)   Types: Cigars, Cigarettes   Start date: 02/11/1982   Quit date: 02/12/2000   Years since quitting: 23.1  Smokeless Tobacco Never   Social History   Substance and Sexual Activity  Alcohol Use Not Currently   Alcohol/week: 0.0 - 1.0 standard drinks of alcohol   Comment: sober    Family History:  Family History  Problem Relation Age of Onset   Heart murmur Mother    Heart defect Brother     Past medical history, surgical history, medications, allergies, family history and social history reviewed with patient today and changes made to appropriate areas of the chart.   ROS All other ROS negative except what is listed above and in the HPI.      Objective:    There were no vitals taken for this visit.  Wt Readings from Last 3 Encounters:  02/24/23 248 lb 6.4 oz (112.7 kg)  08/27/20 255 lb 1.2 oz (115.7 kg)  08/19/20 255 lb (115.7 kg)    Physical Exam  Results for orders placed or performed in visit on 02/24/23  Microalbumin, Urine Waived   Collection Time: 02/24/23  8:38 AM  Result Value Ref Range   Microalb, Ur Waived 10 0 - 19 mg/L   Creatinine, Urine Waived 50 10 - 300 mg/dL   Microalb/Creat Ratio 30-300 (H) <30 mg/g  Hemoglobin A1c   Collection Time: 02/24/23  8:42 AM  Result Value Ref Range   Hgb A1c MFr Bld 6.1 (H) 4.8 - 5.6 %   Est. average glucose Bld gHb Est-mCnc 128 mg/dL  Comprehensive metabolic panel   Collection Time: 02/24/23  8:42 AM  Result Value Ref Range   Glucose 97 70 - 99 mg/dL   BUN 7 (L) 8 - 27 mg/dL   Creatinine, Ser 3.66 (L) 0.76 - 1.27 mg/dL   eGFR 440 >34 VQ/QVZ/5.63   BUN/Creatinine Ratio 9 (L) 10 - 24   Sodium 138 134 - 144 mmol/L   Potassium 4.3 3.5 - 5.2 mmol/L   Chloride 97 96 - 106 mmol/L   CO2 27 20 - 29 mmol/L   Calcium 9.4 8.6 - 10.2 mg/dL   Total Protein 6.4 6.0 - 8.5 g/dL   Albumin 4.1 3.9 - 4.9 g/dL   Globulin, Total 2.3 1.5 - 4.5 g/dL   Bilirubin Total  0.4 0.0 - 1.2 mg/dL   Alkaline Phosphatase 115 44 - 121 IU/L   AST 10 0 - 40 IU/L   ALT 11 0 - 44 IU/L  Hepatitis C antibody   Collection Time: 02/24/23  8:42 AM  Result Value Ref Range   Hep C Virus Ab Non Reactive Non Reactive  HIV Antibody (routine testing w rflx)   Collection Time: 02/24/23  8:42 AM  Result Value Ref Range   HIV Screen 4th Generation wRfx Non Reactive Non Reactive  Lipid panel   Collection Time: 02/24/23  8:42 AM  Result Value Ref Range   Cholesterol, Total 125 100 - 199 mg/dL   Triglycerides 48 0 - 149 mg/dL   HDL 48 >87 mg/dL   VLDL Cholesterol Cal 11 5 - 40 mg/dL   LDL Chol Calc (NIH) 66 0 - 99 mg/dL   Chol/HDL Ratio 2.6 0.0 - 5.0 ratio  Vitamin D (25 hydroxy)   Collection Time: 02/24/23  8:42 AM  Result Value Ref Range   Vit D, 25-Hydroxy 69.7 30.0 - 100.0 ng/mL      Assessment & Plan:   Problem List Items Addressed This Visit   None    Discussed aspirin prophylaxis for myocardial infarction prevention and decision was {Blank single:19197::"it was not indicated","made to continue ASA","made to start ASA","made to stop ASA","that we recommended ASA, and patient refused"}  LABORATORY TESTING:  Health maintenance labs ordered today as discussed above.   The natural history of prostate cancer and ongoing controversy regarding screening and potential treatment outcomes of prostate cancer has been discussed with the patient. The meaning of a false positive PSA and a false negative PSA has been discussed. He indicates understanding of the limitations of this screening test and wishes *** to proceed with screening PSA testing.   IMMUNIZATIONS:   - Tdap: Tetanus vaccination status reviewed: {tetanus status:315746}. - Influenza: {Blank single:19197::"Up to date","Administered today","Postponed to flu season","Refused","Given elsewhere"} - Pneumovax: {Blank single:19197::"Up to date","Administered today","Not applicable","Refused","Given elsewhere"} -  Prevnar: {Blank single:19197::"Up to date","Administered today","Not  applicable","Refused","Given elsewhere"} - COVID: {Blank single:19197::"Up to date","Administered today","Not applicable","Refused","Given elsewhere"} - HPV: {Blank single:19197::"Up to date","Administered today","Not applicable","Refused","Given elsewhere"} - Shingrix vaccine: {Blank single:19197::"Up to date","Administered today","Not applicable","Refused","Given elsewhere"}  SCREENING: - Colonoscopy: {Blank single:19197::"Up to date","Ordered today","Not applicable","Refused","Done elsewhere"}  Discussed with patient purpose of the colonoscopy is to detect colon cancer at curable precancerous or early stages   - AAA Screening: {Blank single:19197::"Up to date","Ordered today","Not applicable","Refused","Done elsewhere"}  -Hearing Test: {Blank single:19197::"Up to date","Ordered today","Not applicable","Refused","Done elsewhere"}  -Spirometry: {Blank single:19197::"Up to date","Ordered today","Not applicable","Refused","Done elsewhere"}   PATIENT COUNSELING:    Sexuality: Discussed sexually transmitted diseases, partner selection, use of condoms, avoidance of unintended pregnancy  and contraceptive alternatives.   Advised to avoid cigarette smoking.  I discussed with the patient that most people either abstain from alcohol or drink within safe limits (<=14/week and <=4 drinks/occasion for males, <=7/weeks and <= 3 drinks/occasion for females) and that the risk for alcohol disorders and other health effects rises proportionally with the number of drinks per week and how often a drinker exceeds daily limits.  Discussed cessation/primary prevention of drug use and availability of treatment for abuse.   Diet: Encouraged to adjust caloric intake to maintain  or achieve ideal body weight, to reduce intake of dietary saturated fat and total fat, to limit sodium intake by avoiding high sodium foods and not adding table salt, and to  maintain adequate dietary potassium and calcium preferably from fresh fruits, vegetables, and low-fat dairy products.    stressed the importance of regular exercise  Injury prevention: Discussed safety belts, safety helmets, smoke detector, smoking near bedding or upholstery.   Dental health: Discussed importance of regular tooth brushing, flossing, and dental visits.   Follow up plan: NEXT PREVENTATIVE PHYSICAL DUE IN 1 YEAR. No follow-ups on file.

## 2023-03-28 ENCOUNTER — Ambulatory Visit (INDEPENDENT_AMBULATORY_CARE_PROVIDER_SITE_OTHER): Payer: Disability Insurance | Admitting: Nurse Practitioner

## 2023-03-28 ENCOUNTER — Encounter: Payer: Self-pay | Admitting: Nurse Practitioner

## 2023-03-28 VITALS — BP 138/85 | HR 65 | Ht 71.0 in | Wt 247.2 lb

## 2023-03-28 DIAGNOSIS — Z7985 Long-term (current) use of injectable non-insulin antidiabetic drugs: Secondary | ICD-10-CM | POA: Diagnosis not present

## 2023-03-28 DIAGNOSIS — E1169 Type 2 diabetes mellitus with other specified complication: Secondary | ICD-10-CM | POA: Diagnosis not present

## 2023-03-28 DIAGNOSIS — E7800 Pure hypercholesterolemia, unspecified: Secondary | ICD-10-CM

## 2023-03-28 DIAGNOSIS — I10 Essential (primary) hypertension: Secondary | ICD-10-CM

## 2023-03-28 DIAGNOSIS — E119 Type 2 diabetes mellitus without complications: Secondary | ICD-10-CM

## 2023-03-28 DIAGNOSIS — Z0000 Encounter for general adult medical examination without abnormal findings: Secondary | ICD-10-CM | POA: Diagnosis not present

## 2023-03-28 DIAGNOSIS — Z1211 Encounter for screening for malignant neoplasm of colon: Secondary | ICD-10-CM

## 2023-03-28 LAB — URINALYSIS, ROUTINE W REFLEX MICROSCOPIC
Bilirubin, UA: NEGATIVE
Glucose, UA: NEGATIVE
Ketones, UA: NEGATIVE
Leukocytes,UA: NEGATIVE
Nitrite, UA: NEGATIVE
Protein,UA: NEGATIVE
RBC, UA: NEGATIVE
Specific Gravity, UA: 1.015 (ref 1.005–1.030)
Urobilinogen, Ur: 1 mg/dL (ref 0.2–1.0)
pH, UA: 7.5 (ref 5.0–7.5)

## 2023-03-28 NOTE — Assessment & Plan Note (Signed)
 Chronic.  Controlled.  Continue with current medication regimen of Atorvastatin daily.  Labs ordered today.  Return to clinic in 6 months for reevaluation.  Call sooner if concerns arise.

## 2023-03-28 NOTE — Assessment & Plan Note (Signed)
 Recommended eating smaller high protein, low fat meals more frequently and exercising 30 mins a day 5 times a week with a goal of 10-15lb weight loss in the next 3 months.

## 2023-03-28 NOTE — Assessment & Plan Note (Signed)
 Chronic.  Controlled.  Last A1c was 6.1%.  Continue with current medication regimen of Mounjaro 5mg , Metformin 1000mg  daily.  Needs updated eye exam.  Declined pneumonia shot.  Labs ordered today.  Return to clinic in 6 months for reevaluation.  Call sooner if concerns arise.

## 2023-03-28 NOTE — Assessment & Plan Note (Signed)
 Chronic.  Controlled.  Continue with current medication regimen of Lisinopril 10mg .  Running a little higher today due to recent fall and pain from his fall.  Labs ordered today.  Return to clinic in 6 months for reevaluation.  Call sooner if concerns arise.

## 2023-03-29 ENCOUNTER — Encounter: Payer: Self-pay | Admitting: Nurse Practitioner

## 2023-03-29 LAB — LIPID PANEL
Chol/HDL Ratio: 2.7 ratio (ref 0.0–5.0)
Cholesterol, Total: 109 mg/dL (ref 100–199)
HDL: 41 mg/dL (ref >39)
LDL Chol Calc (NIH): 51 mg/dL (ref 0–99)
Triglycerides: 83 mg/dL (ref 0–149)
VLDL Cholesterol Cal: 17 mg/dL (ref 5–40)

## 2023-03-29 LAB — COMPREHENSIVE METABOLIC PANEL
ALT: 12 IU/L (ref 0–44)
AST: 10 IU/L (ref 0–40)
Albumin: 4.2 g/dL (ref 3.9–4.9)
Alkaline Phosphatase: 137 IU/L — ABNORMAL HIGH (ref 44–121)
BUN/Creatinine Ratio: 7 — ABNORMAL LOW (ref 10–24)
BUN: 5 mg/dL — ABNORMAL LOW (ref 8–27)
Bilirubin Total: 0.5 mg/dL (ref 0.0–1.2)
CO2: 26 mmol/L (ref 20–29)
Calcium: 9.1 mg/dL (ref 8.6–10.2)
Chloride: 103 mmol/L (ref 96–106)
Creatinine, Ser: 0.73 mg/dL — ABNORMAL LOW (ref 0.76–1.27)
Globulin, Total: 2.1 g/dL (ref 1.5–4.5)
Glucose: 83 mg/dL (ref 70–99)
Potassium: 4.1 mmol/L (ref 3.5–5.2)
Sodium: 142 mmol/L (ref 134–144)
Total Protein: 6.3 g/dL (ref 6.0–8.5)
eGFR: 104 mL/min/{1.73_m2} (ref >59)

## 2023-03-29 LAB — HEMOGLOBIN A1C
Est. average glucose Bld gHb Est-mCnc: 117 mg/dL
Hgb A1c MFr Bld: 5.7 % — ABNORMAL HIGH (ref 4.8–5.6)

## 2023-03-29 LAB — CBC WITH DIFFERENTIAL/PLATELET
Basophils Absolute: 0 10*3/uL (ref 0.0–0.2)
Basos: 0 %
EOS (ABSOLUTE): 0.2 10*3/uL (ref 0.0–0.4)
Eos: 3 %
Hematocrit: 41.5 % (ref 37.5–51.0)
Hemoglobin: 13.9 g/dL (ref 13.0–17.7)
Immature Grans (Abs): 0 10*3/uL (ref 0.0–0.1)
Immature Granulocytes: 0 %
Lymphocytes Absolute: 0.7 10*3/uL (ref 0.7–3.1)
Lymphs: 9 %
MCH: 33 pg (ref 26.6–33.0)
MCHC: 33.5 g/dL (ref 31.5–35.7)
MCV: 99 fL — ABNORMAL HIGH (ref 79–97)
Monocytes Absolute: 0.6 10*3/uL (ref 0.1–0.9)
Monocytes: 8 %
Neutrophils Absolute: 6.3 10*3/uL (ref 1.4–7.0)
Neutrophils: 80 %
Platelets: 198 10*3/uL (ref 150–450)
RBC: 4.21 x10E6/uL (ref 4.14–5.80)
RDW: 13.1 % (ref 11.6–15.4)
WBC: 8 10*3/uL (ref 3.4–10.8)

## 2023-03-29 LAB — TSH: TSH: 0.737 u[IU]/mL (ref 0.450–4.500)

## 2023-03-29 LAB — PSA: Prostate Specific Ag, Serum: 0.2 ng/mL (ref 0.0–4.0)

## 2023-05-04 DIAGNOSIS — H2513 Age-related nuclear cataract, bilateral: Secondary | ICD-10-CM | POA: Diagnosis not present

## 2023-05-04 DIAGNOSIS — E119 Type 2 diabetes mellitus without complications: Secondary | ICD-10-CM | POA: Diagnosis not present

## 2023-05-04 LAB — HM DIABETES EYE EXAM

## 2023-05-15 ENCOUNTER — Ambulatory Visit: Payer: Self-pay

## 2023-05-15 NOTE — Telephone Encounter (Signed)
 Copied from CRM [id number]. Topic: Clinical - Red Word Triage >> May 15, 2023  3:53 PM Lee Lucas wrote: [facility] that prompted transfer to Nurse Triage: neck pain  Chief Complaint: neck pain Symptoms: pain that goes to shoulder Frequency: for 1 month Pertinent Negatives: Patient denies fever, numbness and tingling Disposition: [] ED /[] Urgent Care (no appt availability in office) / [x] Appointment(In office/virtual)/ []  [facility]/ [] Home Care/ [] Refused Recommended Disposition /[] [facility]/ []  Follow-up with PCP Additional Notes: apt made per protocol; care advice given, denies questions; instructed to go to ER if becomes worse.   Reason for Disposition  [1] SEVERE neck pain (e.g., excruciating, unable to do any normal activities) AND [2] not improved after 2 hours of pain medicine  Answer Assessment - Initial Assessment Questions 1. ONSET: "When did the pain begin?"      A month ago 2. LOCATION: "Where does it hurt?"      Back of neck goes to left shoulder 3. PATTERN "Does the pain come and go, or has it been constant since it started?"      constant 4. SEVERITY: "How bad is the pain?"  (Scale 1-10; or mild, moderate, severe)   - NO PAIN (0): no pain or only slight stiffness    - MILD (1-3): doesn't interfere with normal activities    - MODERATE (4-7): interferes with normal activities or awakens from sleep    - SEVERE (8-10):  excruciating pain, unable to do any normal activities      7-8/10 5. RADIATION: "Does the pain go anywhere else, shoot into your arms?"     shoulder 6. CORD SYMPTOMS: "Any weakness or numbness of the arms or legs?"     no 7. CAUSE: "What do you think is causing the neck pain?"     "I don't know" 8. NECK OVERUSE: "Any recent activities that involved turning or twisting the neck?"     no 9. OTHER SYMPTOMS: "Do you have any other symptoms?" (e.g., headache, fever, chest pain, difficulty breathing, neck swelling)     no 10. PREGNANCY:  "Is there any chance you are pregnant?" "When was your last menstrual period?"       na  Protocols used: Neck Pain or Stiffness-A-AH

## 2023-05-16 ENCOUNTER — Telehealth: Payer: Self-pay

## 2023-05-16 NOTE — Telephone Encounter (Signed)
 Copied from CRM [id number]. Topic: Clinical - Prescription Issue >> May 15, 2023  2:28 PM Tiffany S wrote: Reason for CRM: Patient stated he is unable to get his weightloss medication due to high cost 1000 patient is requesting call back

## 2023-05-17 ENCOUNTER — Ambulatory Visit: Admitting: Nurse Practitioner

## 2023-05-17 ENCOUNTER — Encounter: Payer: Self-pay | Admitting: Nurse Practitioner

## 2023-05-17 VITALS — BP 137/67 | HR 68 | Temp 98.0°F | Wt 232.8 lb

## 2023-05-17 DIAGNOSIS — M542 Cervicalgia: Secondary | ICD-10-CM | POA: Insufficient documentation

## 2023-05-17 MED ORDER — CYCLOBENZAPRINE HCL 5 MG PO TABS: 5.0000 mg | ORAL_TABLET | Freq: Three times a day (TID) | ORAL | 0 refills | Status: DC | PRN

## 2023-05-17 MED ORDER — METHYLPREDNISOLONE 4 MG PO TBPK: ORAL_TABLET | ORAL | 0 refills | Status: DC

## 2023-05-17 NOTE — Assessment & Plan Note (Signed)
 Acute and ongoing for a month, possibly related to job. Suspect muscular and possible nerve impingement.  Recent A1c well below goal.  Discussed with patient, will provide steroid taper and recommend he monitor sugars closely.  Alert provider if consistent elevations >250 to 300 and may stop steroid.  Flexeril sent to take as needed, not if driving or working.  Recommend use of Voltaren gel or over the counter lidocaine  patches.  Work on posture.

## 2023-05-17 NOTE — Patient Instructions (Signed)
 Cervical Sprain A cervical sprain is a stretch or tear in one or more of the ligaments in the neck. Ligaments are the tissues that connect bones to each other. Cervical sprains can range from mild to severe. Severe cervical sprains can cause the spinal bones (vertebrae) in the neck to be unstable. This can result in spinal cord damage and serious nervous system problems. Healing time for a cervical sprain depends on the cause and extent of the injury. Most cervical sprains heal in 4-6 weeks. What are the causes? Cervical sprains may be caused by trauma, such as an injury from a motor vehicle accident, a fall, or a sudden forward and backward whipping movement of the head and neck (whiplash injury). Mild cervical sprains may be caused by wear and tear over time. What increases the risk? You are more likely to get a cervical sprain if: You take part in activities that have a high risk of trauma to the neck. These include contact sports, gymnastics, and diving. You have: Osteoarthritis of the spine. Poor strength and flexibility of the neck. Poor posture. You have had a neck injury in the past. You spend long periods in positions that put stress on the neck, such as sitting at a computer. What are the signs or symptoms? Symptoms of this condition include: Any of these problems in the neck, shoulders, or upper back: Pain or tenderness. Stiffness. Swelling. A burning feeling. Sudden tightening of neck muscles (spasms). Limited ability to move the neck. Headache. Dizziness. Nausea or vomiting. Weakness, numbness, or tingling in a hand or an arm. Symptoms may develop right away after injury or may develop over a few days. In some cases, symptoms may go away with treatment and return (recur) over time. How is this diagnosed? This condition may be diagnosed based on: Your symptoms, medical history, and a physical exam. Any recent injuries or known neck problems that you have, such as arthritis  in the neck. Imaging tests, such as X-rays, an MRI, or a CT scan. How is this treated? This condition is treated by resting and icing the injured area and doing physical therapy exercises to improve movement and strength. Heat therapy may be used 2-3 days after the injury if there is no swelling. Depending on the severity of your condition, treatment may also include: Keeping your neck in place (immobilized) for periods of time. This may be done using: A cervical collar. This supports your chin and the back of your head. A cervical traction device. This is a sling that holds up your head. It removes weight and pressure from your neck. Medicines for pain or other symptoms. Surgery. This is rare. Follow these instructions at home: Medicines Take over-the-counter and prescription medicines only as told by your health care provider. Ask your provider if the medicine prescribed to you: Requires you to avoid driving or using machinery. Can cause constipation. You may need to take these actions to prevent or treat constipation: Drink enough fluid to keep your pee pale yellow. Take over-the-counter or prescription medicines. Eat foods that are high in fiber, such as beans, whole grains, and fresh fruits and vegetables. Limit foods that are high in fat and processed sugars, such as fried or sweet foods. If you have a cervical collar: Wear the collar as told by your provider. Do not remove it unless told. Ask before making any adjustments to your collar. If you have long hair, keep it outside of the collar. If you are allowed to remove the  collar for cleaning and bathing: Follow instructions about how to remove it safely. Clean it by hand with mild soap and water and air-dry it completely. If your collar has removable pads, remove them every 1-2 days and wash them by hand with soap and water. Let them air-dry completely before putting them back in the collar. Tell your provider if your skin under  the collar has irritation or sores. Managing pain, stiffness, and swelling     Use a cervical traction device as told. If told, put ice on the affected area. Put ice in a plastic bag. Place a towel between your skin and the bag. Leave the ice on for 20 minutes, 2-3 times a day. If told, apply heat to the affected area before you exercise or as often as told by your provider. Use the heat source that your provider recommends, such as a moist heat pack or a heating pad. Place a towel between your skin and the heat source. Leave the heat on for 20-30 minutes. If your skin turns bright red, remove the ice or heat right away to prevent skin damage. The risk of damage is higher if you cannot feel pain, heat, or cold. Activity Do not drive while wearing a cervical collar. If you do not have a cervical collar, ask if it is safe to drive while your neck heals. Do not lift anything that is heavier than 10 lb (4.5 kg) until your provider says that it is safe. Rest as told by your provider. Avoid positions and activities that make your symptoms worse. Do physical therapy exercises as told by your provider or physical therapist. Return to your normal activities as told by your provider. Ask your provider what activities are safe for you. General instructions Do not use any products that contain nicotine or tobacco. These products include cigarettes, chewing tobacco, and vaping devices, such as e-cigarettes. These can delay healing. If you need help quitting, ask your provider. Keep all follow-up visits. Your provider will monitor your injury and activity level. How is this prevented? To prevent a cervical sprain from happening again: Use and maintain good posture. Make any needed adjustments to your workstation to help you do this. Exercise regularly as told by your provider or physical therapist. Avoid risky activities that may cause a cervical sprain. Contact a health care provider if: You have  symptoms that get worse or do not get better after 2 weeks of treatment. You have new symptoms. Your pain gets worse or does not get better with medicine. You have sores or irritated skin on your neck from wearing your cervical collar. Get help right away if: You have severe pain. You develop numbness, tingling, or weakness in any part of your body. You cannot move a part of your body (you have paralysis). You have neck pain along with severe dizziness or headache. This information is not intended to replace advice given to you by your health care provider. Make sure you discuss any questions you have with your health care provider. Document Revised: 07/30/2021 Document Reviewed: 07/30/2021 Elsevier Patient Education  2024 [employer].

## 2023-05-17 NOTE — Progress Notes (Signed)
 BP 137/67   Pulse 68   Temp 98 F (36.7 C) (Oral)   Wt 232 lb 12.8 oz (105.6 kg)   SpO2 96%   BMI 32.47 kg/m    Subjective:    Patient ID: Lee Lucas, male    DOB: [birth year], 63 y.o.   MRN: [id number]  HPI: Lee Lucas is a 63 y.o. male  Chief Complaint  Patient presents with   Neck Pain    Patient states he has been having neck pain for the last month. States he looks up at lot for his job and wonders if the pain has come from that. States the pain is constant. States he has used mainly ice of his neck, and some heat occasionally.    NECK PAIN   Presents today for neck pain for over a month. At his job is looking up a lot, is in shipping and receiving.  When first started felt like a crick and now constantly hurting.  Has not had pain like this before. Treatments attempted: ice and heat, Tylenol , Ibuprofen Relief with NSAIDs?:  mild Location:L>R and midline Duration:weeks Severity: 0/10 today as has not had to work, at worst is a 10/10 Quality: dull, aching, pulling, and throbbing Frequency: constant Radiation: none Aggravating factors: looking up and down or turning head Alleviating factors: nothing Weakness:  no Paresthesias / decreased sensation:  no  Fevers:  no   Relevant past medical, surgical, family and social history reviewed and updated as indicated. Interim medical history since our last visit reviewed. Allergies and medications reviewed and updated.  Review of Systems  Constitutional:  Negative for activity change, diaphoresis, fatigue and fever.  Respiratory:  Negative for cough, chest tightness, shortness of breath and wheezing.   Cardiovascular:  Negative for chest pain, palpitations and leg swelling.  Gastrointestinal: Negative.   Endocrine: Negative for polydipsia, polyphagia and polyuria.  Musculoskeletal:  Positive for neck pain.  Skin: Negative.   Neurological: Negative.   Psychiatric/Behavioral: Negative.     Per HPI unless  specifically indicated above     Objective:    BP 137/67   Pulse 68   Temp 98 F (36.7 C) (Oral)   Wt 232 lb 12.8 oz (105.6 kg)   SpO2 96%   BMI 32.47 kg/m   Wt Readings from Last 3 Encounters:  05/17/23 232 lb 12.8 oz (105.6 kg)  03/28/23 247 lb 3.2 oz (112.1 kg)  02/24/23 248 lb 6.4 oz (112.7 kg)    Physical Exam Vitals and nursing note reviewed.  Constitutional:      General: He is awake. He is not in acute distress.    Appearance: He is well-developed and well-groomed. He is obese. He is not ill-appearing or toxic-appearing.  HENT:     Head: Normocephalic.     Right Ear: Hearing and external ear normal.     Left Ear: Hearing and external ear normal.  Eyes:     General: Lids are normal.     Extraocular Movements: Extraocular movements intact.     Conjunctiva/sclera: Conjunctivae normal.  Neck:     Thyroid: No thyromegaly.     Vascular: No carotid bruit.     Comments: Pain with flexion and rotation/lateral movement bilaterally. No rashes. Mild kyphosis noted. Cardiovascular:     Rate and Rhythm: Normal rate and regular rhythm.     Heart sounds: Normal heart sounds. No murmur heard.    No gallop.  Pulmonary:     Effort: No accessory muscle  usage or respiratory distress.     Breath sounds: Normal breath sounds.  Abdominal:     General: Bowel sounds are normal. There is no distension.     Palpations: Abdomen is soft.     Tenderness: There is no abdominal tenderness.  Musculoskeletal:     Cervical back: No edema, torticollis or crepitus. Pain with movement and muscular tenderness present. No spinous process tenderness. Normal range of motion.     Right lower leg: No edema.     Left lower leg: No edema.  Lymphadenopathy:     Cervical: No cervical adenopathy.  Skin:    General: Skin is warm.     Capillary Refill: Capillary refill takes less than 2 seconds.  Neurological:     Mental Status: He is alert and oriented to person, place, and time.     Deep Tendon  Reflexes: Reflexes are normal and symmetric.     Reflex Scores:      Brachioradialis reflexes are 2+ on the right side and 2+ on the left side.      Patellar reflexes are 2+ on the right side and 2+ on the left side. Psychiatric:        Attention and Perception: Attention normal.        Mood and Affect: Mood normal.        Speech: Speech normal.        Behavior: Behavior normal. Behavior is cooperative.        Thought Content: Thought content normal.    Results for orders placed or performed in visit on 05/12/23  HM DIABETES EYE EXAM   Collection Time: 05/04/23 10:58 AM  Result Value Ref Range   HM Diabetic Eye Exam No Retinopathy No Retinopathy      Assessment & Plan:   Problem List Items Addressed This Visit       Other   Neck pain - Primary   Acute and ongoing for a month, possibly related to job. Suspect muscular and possible nerve impingement.  Recent A1c well below goal.  Discussed with patient, will provide steroid taper and recommend he monitor sugars closely.  Alert provider if consistent elevations >250 to 300 and may stop steroid.  Flexeril sent to take as needed, not if driving or working.  Recommend use of Voltaren gel or over the counter lidocaine  patches.  Work on posture.        Follow up plan: Return if symptoms worsen or fail to improve.

## 2023-05-18 ENCOUNTER — Telehealth: Payer: Self-pay | Admitting: Nurse Practitioner

## 2023-05-18 DIAGNOSIS — E1169 Type 2 diabetes mellitus with other specified complication: Secondary | ICD-10-CM

## 2023-05-18 NOTE — Telephone Encounter (Signed)
 Patient was seen in the office for another concern but let me know that he was not able to get his mounjaro was costing over $1000.  Is there something we can do to help with the cost?

## 2023-05-22 ENCOUNTER — Telehealth: Payer: Self-pay

## 2023-05-22 ENCOUNTER — Telehealth: Payer: Self-pay | Admitting: Nurse Practitioner

## 2023-05-22 NOTE — Progress Notes (Signed)
 Care Guide Pharmacy Note  05/22/2023 Name: Lee Lucas MRN: [id number] DOB: [birth year]  Referred By: Aileen Alexanders, NP Reason for referral: Complex Care Management (Outreach to schedule with Pharm d )   Lee Lucas is a 63 y.o. year old male who is a primary care patient of Aileen Alexanders, NP.  [name] was referred to the pharmacist for assistance related to: DMII  Successful contact was made with the patient to discuss pharmacy services including being ready for the pharmacist to call at least 5 minutes before the scheduled appointment time and to have medication bottles and any blood pressure readings ready for review. The patient agreed to meet with the pharmacist via telephone visit on (date/time).06/02/2023  Lenton Rail , RMA     [place]  [facility], [facility] Guide  Direct Dial: [phone]  Website: [email or web address]

## 2023-05-22 NOTE — Telephone Encounter (Signed)
 Copied from CRM [id number]. Topic: Clinical - Medication Question >> May 22, 2023 12:23 PM Alpha Arts wrote: Reason for CRM: Patient would like to know if he will receive his tirzepatide (MOUNJARO) 5 MG/0.5ML Pen. He has to take it on Saturday if Aileen Alexanders, NP would like him to continue it.  Callback #: [phone]

## 2023-05-24 ENCOUNTER — Telehealth: Payer: Self-pay

## 2023-05-24 NOTE — Telephone Encounter (Signed)
 Can the increased dose be sent in for the patient?

## 2023-05-24 NOTE — Telephone Encounter (Signed)
 He told me when he was in the office that it was really expensive.  I put in a referral to Bartholome[facility] so she could help with the cost.  He does not need to increase the dose based on his A1c.

## 2023-05-24 NOTE — Telephone Encounter (Signed)
 Copied from CRM [id number]. Topic: Clinical - Prescription Issue >> May 24, 2023  1:16 PM Oddis Bench wrote: Reason for CRM: tirzepatide (MOUNJARO) 5 MG/0.5ML Pen the patient is calling about the prescription he is stating that he has not had a shot this week. He is stating that he is suppose to be up to .75 dosage.

## 2023-05-25 NOTE — Telephone Encounter (Signed)
 Called and spoke with patient. Patient is ok with having the referral placed to [facility] and the pharmacy team.

## 2023-05-29 NOTE — Telephone Encounter (Signed)
 Copied from CRM [id number]. Topic: Clinical - Prescription Issue >> May 29, 2023 12:14 PM Star East wrote: Reason for CRM: tirzepatide (MOUNJARO) 5 MG/0.5ML Pen- still costs over $1000- has been out for too long and needs a higher dose - please call [phone]

## 2023-05-30 ENCOUNTER — Other Ambulatory Visit: Payer: Self-pay

## 2023-05-30 NOTE — Telephone Encounter (Unsigned)
 Copied from CRM [id number]. Topic: Clinical - Prescription Issue >> May 30, 2023 12:12 PM Fonda T wrote: Reason for CRM: Patient states he is calling requesting refill on medication of tirzepatide (MOUNJARO) 5 MG/0.5ML Pen.  Per patient states he has been out of medication for 2 weeks.  Patient requesting a return call to discuss further. Per patient the cost per pharmacy is too expensive and wants to know if an authorization can be done to get medication as soon as possible at [phone].   Preferred pharmacy:  [facility] [address], [place] - [address] [place] [address] Phone: [phone] Fax: [phone] Hours: Not open 24 hours

## 2023-06-02 ENCOUNTER — Other Ambulatory Visit: Payer: Self-pay

## 2023-06-02 NOTE — Progress Notes (Signed)
 06/02/2023 Name: Lee Lucas MRN: [id number] DOB: [birth year]  Chief Complaint  Patient presents with   Diabetes Management Plan   Lee Lucas is a 63 y.o. year old male who presented for a telephone visit.   They were referred to the pharmacist by their PCP for assistance in managing medication access.    Subjective:  Care Team: Primary Care Provider: Aileen Alexanders, NP ; Next Scheduled Visit: 9/18  Medication Access/Adherence  Current Pharmacy:  [facility] [address], [place] - [address] [place] [address] Phone: [phone] Fax: [phone]  -Patient reports affordability concerns with their medications: Yes  -Patient reports access/transportation concerns to their pharmacy: No  -Patient reports adherence concerns with their medications:  Yes    Diabetes: Current medications: metformin  1000mg  daily -Medications tried in the past: patient was taking Mounjaro 5mg  weekly but has not had in 2 weeks due to cost of medication -Patient was paying $25/month for Mounjaro 5mg , but copay on 5/16 was going to be >$1000 -Has been well controlled on regimen with recent A1c of 5.7%  Objective:  Lab Results  Component Value Date   HGBA1C 5.7 (H) 03/28/2023   Lab Results  Component Value Date   CREATININE 0.73 (L) 03/28/2023   BUN 5 (L) 03/28/2023   NA 142 03/28/2023   K 4.1 03/28/2023   CL 103 03/28/2023   CO2 26 03/28/2023   Medications Reviewed Today     Reviewed by Linn Rich, RPH (Pharmacist) on 06/02/23 at 0907  Med List Status: <None>   Medication Order Taking? Sig Documenting Provider Last Dose Status Informant  aspirin  EC 325 MG tablet [id number]  Take 1 tablet (325 mg total) by mouth daily. Rojelio Clement, PA-C  Active   atorvastatin  (LIPITOR) 40 MG tablet [id number]  Take 1 tablet (40 mg total) by mouth at bedtime. Aileen Alexanders, NP  Active   cyclobenzaprine  (FLEXERIL ) 5 MG tablet [id number]  Take 1 tablet  (5 mg total) by mouth 3 (three) times daily as needed for muscle spasms. Do not take if driving or working. Cannady, Jolene T, NP  Active   lisinopril  (ZESTRIL ) 10 MG tablet [id number]  Take 1 tablet (10 mg total) by mouth daily. Aileen Alexanders, NP  Active   metFORMIN  (GLUCOPHAGE ) 1000 MG tablet [id number] Yes Take 1,000 mg by mouth daily with breakfast. [provider] Taking Active Self  methylPREDNISolone  (MEDROL  DOSEPAK) 4 MG TBPK tablet [id number]  Take as instructed on packaging. Cannady, Jolene T, NP  Active   pantoprazole (PROTONIX) 20 MG tablet [id number]  Take 20 mg by mouth daily. [provider]  Active   sildenafil  (VIAGRA ) 100 MG tablet [id number]  Take 0.5-1 tablets (50-100 mg total) by mouth daily as needed for erectile dysfunction. Aileen Alexanders, NP  Active   tirzepatide [facility]) 5 MG/0.5ML Pen [id number] No Inject 5 mg into the skin once a week.  Patient not taking: Reported on 06/02/2023   Aileen Alexanders, NP Not Taking Active   vitamin B-12 (CYANOCOBALAMIN) 100 MCG tablet [id number]  Take 100 mcg by mouth daily. [provider]  Active   Vitamin D , Ergocalciferol , (DRISDOL ) 1.25 MG (50000 UNIT) CAPS capsule [id number]  Take 50,000 Units by mouth once a week. [provider]  Active Self           Assessment/Plan:   Diabetes: -Currently controlled -Contacted insurance, and the plan requires a 90 day supply of Mounjaro 5mg  to be filled moving forward at  retail pharmacy to prevent "refill penalty."  [facility], [facility], [facility], and mail order can fill 90 day supply for $75 copay. -Contacted [facility] to see if prescription on file has a 90 day supply remaining and if they will fill a 90 day supply.  Pharmacy was able to process a 3 month supply, and it actually went through for $25.  Made patient aware. -Patient will resume Mounjaro 5mg  weekly and continue metformin  1000mg  daily -If additional weight loss desired after this 3 month refill,  could consider increasing to 7.5mg  weekly and stopping metformin   Linn Rich, PharmD, DPLA

## 2023-06-07 MED ORDER — TIRZEPATIDE 5 MG/0.5ML ~~LOC~~ SOAJ: 5.0000 mg | SUBCUTANEOUS | 1 refills | Status: DC

## 2023-06-07 NOTE — Telephone Encounter (Signed)
 Contacted [facility] to follow up and find out why medication is not going through. Pharmacy states that the patient is out of refills and needs a new prescription sent in so that they can try to run it through insurance. Routing to provider for RX.

## 2023-07-07 DIAGNOSIS — G8929 Other chronic pain: Secondary | ICD-10-CM | POA: Diagnosis not present

## 2023-07-07 DIAGNOSIS — M19011 Primary osteoarthritis, right shoulder: Secondary | ICD-10-CM | POA: Diagnosis not present

## 2023-07-13 ENCOUNTER — Telehealth: Payer: Self-pay | Admitting: Nurse Practitioner

## 2023-07-13 MED ORDER — VITAMIN D (ERGOCALCIFEROL) 1.25 MG (50000 UNIT) PO CAPS: 50000.0000 [IU] | ORAL_CAPSULE | ORAL | 2 refills | Status: DC

## 2023-07-13 NOTE — Telephone Encounter (Signed)
 Medication sent to the pharmacy.

## 2023-07-13 NOTE — Telephone Encounter (Signed)
 Patient notified

## 2023-07-13 NOTE — Telephone Encounter (Unsigned)
 Copied from CRM [id number]. Topic: Clinical - Medication Refill >> Jul 13, 2023 10:07 AM Nathanel BROCKS wrote: Medication: Vitamin D , Ergocalciferol , (DRISDOL ) 1.25 MG (50000 UNIT) CAPS capsule  Has the patient contacted their pharmacy? Yes  This is the patient's preferred pharmacy:  [facility] [address] [place] - [address] [place] [address] Phone: [phone] Fax: [phone]  Is this the correct pharmacy for this prescription? Yes If no, delete pharmacy and type the correct one.   Has the prescription been filled recently? Yes  Is the patient out of the medication? Yes  Has the patient been seen for an appointment in the last year OR does the patient have an upcoming appointment? Yes  Can we respond through MyChart? No  Agent: Please be advised that Rx refills may take up to 3 business days. We ask that you follow-up with your pharmacy.

## 2023-08-07 ENCOUNTER — Other Ambulatory Visit: Payer: Self-pay | Admitting: Nurse Practitioner

## 2023-08-07 NOTE — Telephone Encounter (Unsigned)
 Copied from CRM [id number]. Topic: Clinical - Medication Refill >> Aug 07, 2023  9:32 AM Larissa S wrote: Medication: pantoprazole  (PROTONIX ) 20 MG tablet,   Has the patient contacted their pharmacy? Yes (Agent: If no, request that the patient contact the pharmacy for the refill. If patient does not wish to contact the pharmacy document the reason why and proceed with request.) (Agent: If yes, when and what did the pharmacy advise?)  This is the patient's preferred pharmacy:  [facility] [address], [place] - [address] [place] [address] Phone: [phone] Fax: [phone]  Is this the correct pharmacy for this prescription? Yes If no, delete pharmacy and type the correct one.   Has the prescription been filled recently? No  Is the patient out of the medication? Yes  Has the patient been seen for an appointment in the last year OR does the patient have an upcoming appointment? Yes  Can we respond through MyChart? No  Agent: Please be advised that Rx refills may take up to 3 business days. We ask that you follow-up with your pharmacy.

## 2023-08-08 MED ORDER — PANTOPRAZOLE SODIUM 20 MG PO TBEC: 20.0000 mg | DELAYED_RELEASE_TABLET | Freq: Every day | ORAL | 1 refills | Status: DC

## 2023-08-08 NOTE — Telephone Encounter (Signed)
 Requested medications are due for refill today.  unsure  Requested medications are on the active medications list.  yes  Last refill. 05/17/2023  Future visit scheduled.   yes  Notes to clinic.  Medication is historical.    Requested Prescriptions  Pending Prescriptions Disp Refills   pantoprazole  (PROTONIX ) 20 MG tablet      Sig: Take 1 tablet (20 mg total) by mouth daily.     Gastroenterology: Proton Pump Inhibitors Passed - 08/08/2023  2:08 PM      Passed - Valid encounter within last 12 months    Recent Outpatient Visits           2 months ago Neck pain   [place] [facility] Manderson, Melanie T, NP   4 months ago Annual physical exam   [place] [facility] Melvin Pao, NP   5 months ago Type 2 diabetes mellitus with other specified complication, without long-term current use of insulin  [employer])   [place] [facility] Melvin Pao, NP

## 2023-08-18 DIAGNOSIS — K2270 Barrett's esophagus without dysplasia: Secondary | ICD-10-CM | POA: Diagnosis not present

## 2023-08-18 DIAGNOSIS — Z1211 Encounter for screening for malignant neoplasm of colon: Secondary | ICD-10-CM | POA: Diagnosis not present

## 2023-08-18 DIAGNOSIS — K219 Gastro-esophageal reflux disease without esophagitis: Secondary | ICD-10-CM | POA: Diagnosis not present

## 2023-09-06 ENCOUNTER — Encounter
Admission: RE | Disposition: A | Discharge status: Home / Self Care | Payer: Self-pay | Source: Home / Self Care | Attending: Internal Medicine

## 2023-09-06 ENCOUNTER — Ambulatory Visit: Payer: Self-pay

## 2023-09-06 ENCOUNTER — Ambulatory Visit
Admission: RE | Admit: 2023-09-06 | Discharge: 2023-09-06 | Disposition: A | Discharge status: Home / Self Care | Attending: Internal Medicine | Admitting: Internal Medicine

## 2023-09-06 ENCOUNTER — Other Ambulatory Visit: Payer: Self-pay | Admitting: Internal Medicine

## 2023-09-06 ENCOUNTER — Encounter: Payer: Self-pay | Admitting: Internal Medicine

## 2023-09-06 ENCOUNTER — Other Ambulatory Visit: Payer: Self-pay

## 2023-09-06 DIAGNOSIS — Z7985 Long-term (current) use of injectable non-insulin antidiabetic drugs: Secondary | ICD-10-CM | POA: Diagnosis not present

## 2023-09-06 DIAGNOSIS — K5730 Diverticulosis of large intestine without perforation or abscess without bleeding: Secondary | ICD-10-CM | POA: Diagnosis not present

## 2023-09-06 DIAGNOSIS — Z1211 Encounter for screening for malignant neoplasm of colon: Secondary | ICD-10-CM | POA: Insufficient documentation

## 2023-09-06 DIAGNOSIS — E119 Type 2 diabetes mellitus without complications: Secondary | ICD-10-CM | POA: Insufficient documentation

## 2023-09-06 DIAGNOSIS — Z87891 Personal history of nicotine dependence: Secondary | ICD-10-CM | POA: Insufficient documentation

## 2023-09-06 DIAGNOSIS — Z79899 Other long term (current) drug therapy: Secondary | ICD-10-CM | POA: Insufficient documentation

## 2023-09-06 DIAGNOSIS — K2270 Barrett's esophagus without dysplasia: Secondary | ICD-10-CM | POA: Insufficient documentation

## 2023-09-06 DIAGNOSIS — Z7984 Long term (current) use of oral hypoglycemic drugs: Secondary | ICD-10-CM | POA: Diagnosis not present

## 2023-09-06 DIAGNOSIS — K219 Gastro-esophageal reflux disease without esophagitis: Secondary | ICD-10-CM | POA: Insufficient documentation

## 2023-09-06 DIAGNOSIS — K640 First degree hemorrhoids: Secondary | ICD-10-CM | POA: Insufficient documentation

## 2023-09-06 DIAGNOSIS — I10 Essential (primary) hypertension: Secondary | ICD-10-CM | POA: Insufficient documentation

## 2023-09-06 DIAGNOSIS — K648 Other hemorrhoids: Secondary | ICD-10-CM | POA: Diagnosis not present

## 2023-09-06 HISTORY — PX: ESOPHAGOGASTRODUODENOSCOPY: SHX5428

## 2023-09-06 HISTORY — PX: COLONOSCOPY: SHX5424

## 2023-09-06 LAB — GLUCOSE, CAPILLARY: Glucose-Capillary: 110 mg/dL — ABNORMAL HIGH (ref 70–99)

## 2023-09-06 SURGERY — COLONOSCOPY
Anesthesia: General

## 2023-09-06 MED ORDER — LIDOCAINE HCL (PF) 2 % IJ SOLN
INTRAMUSCULAR | Status: AC
  Filled 2023-09-06: qty 5

## 2023-09-06 MED ORDER — STERILE WATER FOR IRRIGATION IR SOLN
Status: DC | PRN
  Administered 2023-09-06: 1

## 2023-09-06 MED ORDER — SODIUM CHLORIDE 0.9 % IV SOLN: INTRAVENOUS | Status: DC

## 2023-09-06 MED ORDER — PROPOFOL 10 MG/ML IV BOLUS
INTRAVENOUS | Status: DC | PRN
  Administered 2023-09-06: 120 ug/kg/min via INTRAVENOUS

## 2023-09-06 MED ORDER — LIDOCAINE HCL (CARDIAC) PF 100 MG/5ML IV SOSY
PREFILLED_SYRINGE | INTRAVENOUS | Status: DC | PRN
  Administered 2023-09-06: 100 mg via INTRAVENOUS

## 2023-09-06 MED ORDER — PROPOFOL 1000 MG/100ML IV EMUL
INTRAVENOUS | Status: AC
  Filled 2023-09-06: qty 100

## 2023-09-06 NOTE — H&P (Signed)
 Outpatient short stay form Pre-procedure 09/06/2023 8:22 AM Lee Lucas, M.D.  Primary Physician: Darice Petty, NP  Reason for visit:  Barrett's esophagus, colon cancer screening  History of present illness:  63  y/o patient has a personal history of Barrett's esophagus without dysplasia. Patient denies hemetemesis, nausea, vomiting, dysphagia, weight loss. Takes PPI without significant side effects.    Patient presents for colonoscopy for colon cancer screening. The patient denies complaints of abdominal pain, significant change in bowel habits, or rectal bleeding.    Current Facility-Administered Medications:    0.9 %  sodium chloride  infusion, , Intravenous, Continuous, [place], Dak Szumski K, MD, Last Rate: 20 mL/hr at 09/06/23 0803, New Bag at 09/06/23 0803  Medications Prior to Admission  Medication Sig Dispense Refill Last Dose/Taking   aspirin  EC 325 MG tablet Take 1 tablet (325 mg total) by mouth daily. 30 tablet 0 Past Week   atorvastatin  (LIPITOR) 40 MG tablet Take 1 tablet (40 mg total) by mouth at bedtime.   09/05/2023   cyclobenzaprine  (FLEXERIL ) 5 MG tablet Take 1 tablet (5 mg total) by mouth 3 (three) times daily as needed for muscle spasms. Do not take if driving or working. 30 tablet 0 Past Week   hydrochlorothiazide (HYDRODIURIL) 12.5 MG tablet Take 12.5 mg by mouth daily.   09/05/2023   lisinopril  (ZESTRIL ) 10 MG tablet Take 1 tablet (10 mg total) by mouth daily. 90 tablet 1 09/05/2023   metFORMIN  (GLUCOPHAGE ) 1000 MG tablet Take 1,000 mg by mouth daily with breakfast.   Past Week   pantoprazole  (PROTONIX ) 20 MG tablet Take 1 tablet (20 mg total) by mouth daily. 90 tablet 1 09/05/2023   pioglitazone  (ACTOS ) 45 MG tablet Take 45 mg by mouth daily.   Taking   tirzepatide  (MOUNJARO ) 5 MG/0.5ML Pen Inject 5 mg into the skin once a week. 6 mL 1 Past Week   methylPREDNISolone  (MEDROL  DOSEPAK) 4 MG TBPK tablet Take as instructed on packaging. 21 each 0    sildenafil  (VIAGRA )  100 MG tablet Take 0.5-1 tablets (50-100 mg total) by mouth daily as needed for erectile dysfunction. 10 tablet 4    vitamin B-12 (CYANOCOBALAMIN ) 100 MCG tablet Take 100 mcg by mouth daily.      Vitamin D , Ergocalciferol , (DRISDOL ) 1.25 MG (50000 UNIT) CAPS capsule Take 1 capsule (50,000 Units total) by mouth once a week. 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    Physical Exam  Gen: Alert, oriented. Appears stated age.  HEENT: [place]/AT. PERRLA. Lungs: CTA, no wheezes. CV: RR nl S1, S2. Abd: soft, benign, no masses. BS+ Ext: No edema. Pulses 2+    Planned procedures: Proceed with EGD and colonoscopy. The patient understands the nature of the planned procedure, indications, risks, alternatives and potential complications including but not limited to bleeding, infection, perforation, damage to internal organs and possible oversedation/side effects from anesthesia. The patient agrees and gives consent to proceed.  Please refer to procedure notes for findings, recommendations and patient disposition/instructions.     Lee Lucas, M.D. Gastroenterology 09/06/2023  8:22 AM

## 2023-09-06 NOTE — Anesthesia Postprocedure Evaluation (Signed)
 Anesthesia Post Note  Patient: Lee Lucas  Procedure(s) Performed: COLONOSCOPY EGD (ESOPHAGOGASTRODUODENOSCOPY)  Patient location during evaluation: PACU Anesthesia Type: General Level of consciousness: awake and alert Pain management: pain level controlled Vital Signs Assessment: post-procedure vital signs reviewed and stable Respiratory status: spontaneous breathing, nonlabored ventilation, respiratory function stable and patient connected to nasal cannula oxygen Cardiovascular status: blood pressure returned to baseline and stable Postop Assessment: no apparent nausea or vomiting Anesthetic complications: no   There were no known notable events for this encounter.   Last Vitals:  Vitals:   09/06/23 0800 09/06/23 0900  BP: 138/74 127/73  Pulse: (!) 56 67  Resp: 19 20  Temp: (!) 35.8 C   SpO2: 99% 100%    Last Pain:  Vitals:   09/06/23 0900  TempSrc:   PainSc: 0-No pain                 Lynwood KANDICE Clause

## 2023-09-06 NOTE — Interval H&P Note (Signed)
 History and Physical Interval Note:  09/06/2023 8:23 AM  Lee Lucas  has presented today for surgery, with the diagnosis of K22.70 (ICD-10-CM) - Short-segment Barrett's esophagus K21.9 (ICD-10-CM) - GERD without esophagitis Z12.11 (ICD-10-CM) - Colon cancer screening.  The various methods of treatment have been discussed with the patient and family. After consideration of risks, benefits and other options for treatment, the patient has consented to  Procedure(s) with comments: COLONOSCOPY (N/A) - DM On Mounjaro  EGD (ESOPHAGOGASTRODUODENOSCOPY) (N/A) as a surgical intervention.  The patient's history has been reviewed, patient examined, no change in status, stable for surgery.  I have reviewed the patient's chart and labs.  Questions were answered to the patient's satisfaction.     Clinton, Bonnita Newby

## 2023-09-06 NOTE — Transfer of Care (Signed)
 Immediate Anesthesia Transfer of Care Note  Patient: Lee Lucas  Procedure(s) Performed: COLONOSCOPY EGD (ESOPHAGOGASTRODUODENOSCOPY)  Patient Location: PACU  Anesthesia Type:General  Level of Consciousness: awake and sedated  Airway & Oxygen Therapy: Patient Spontanous Breathing and Patient connected to nasal cannula oxygen  Post-op Assessment: Report given to RN and Post -op Vital signs reviewed and stable  Post vital signs: Reviewed and stable  Last Vitals:  Vitals Value Taken Time  BP    Temp    Pulse    Resp    SpO2      Last Pain:  Vitals:   09/06/23 0800  TempSrc: Temporal  PainSc: 8       Patients Stated Pain Goal: 0 (09/06/23 0800)  Complications: There were no known notable events for this encounter.

## 2023-09-06 NOTE — Anesthesia Preprocedure Evaluation (Signed)
 Anesthesia Evaluation  Patient identified by MRN, date of birth, ID band Patient awake    Reviewed: Allergy & Precautions, H&P , NPO status , Patient's Chart, lab work & pertinent test results, reviewed documented beta blocker date and time   Airway Mallampati: II   Neck ROM: full    Dental  (+) Poor Dentition   Pulmonary neg pulmonary ROS, former smoker   Pulmonary exam normal        Cardiovascular Exercise Tolerance: Good hypertension, On Medications negative cardio ROS Normal cardiovascular exam Rhythm:regular Rate:Normal     Neuro/Psych negative neurological ROS  negative psych ROS   GI/Hepatic Neg liver ROS,GERD  Medicated,,  Endo/Other  negative endocrine ROSdiabetes, Well Controlled    Renal/GU negative Renal ROS  negative genitourinary   Musculoskeletal   Abdominal   Peds  Hematology negative hematology ROS (+)   Anesthesia Other Findings Past Medical History: No date: Arthritis     Comment:  left shoulder, left knee No date: Barrett's esophagus 01/10/1993: 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 No date: Diabetes mellitus without complication (HCC) No date: GERD (gastroesophageal reflux disease) No date: HBP (high blood pressure) No date: Reflux Past Surgical History: 08/27/2020: BICEPT TENODESIS     Comment:  Procedure: BICEPS TENODESIS;  Surgeon: Edie Norleen PARAS,               MD;  Location: [facility] ORS;  Service: Orthopedics;; 08/05/13: COLONOSCOPY WITH ESOPHAGOGASTRODUODENOSCOPY (EGD) 7.22.14: FOOT SURGERY; Left 08/27/2020: REVERSE SHOULDER ARTHROPLASTY; Left     Comment:  Procedure: REVERSE SHOULDER ARTHROPLASTY;  Surgeon:               Edie Norleen PARAS, MD;  Location: [facility] ORS;  Service:               Orthopedics;  Laterality: Left; 01/28/2015: SHOULDER ARTHROSCOPY; Left     Comment:  Procedure: ARTHROSCOPY SHOULDER WITH DEBRIDEMENT               DECOMPRESSION AND MANIPULATION UNDER  ANESTHESIA;                Surgeon: Norleen PARAS Edie, MD;  Location: [facility]               [facility];  Service: Orthopedics;  Laterality: Left;  Diabetic              - 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; Right     Comment:  AND LYMPH NODES BMI    Body Mass Index: 29.47 kg/m     Reproductive/Obstetrics negative OB ROS                              Anesthesia Physical Anesthesia Plan  ASA: 3  Anesthesia Plan: General   Post-op Pain Management:    Induction:   PONV Risk Score and Plan:   Airway Management Planned:   Additional Equipment:   Intra-op Plan:   Post-operative Plan:   Informed Consent: I have reviewed the patients History and Physical, chart, labs and discussed the procedure including the risks, benefits and alternatives for the proposed anesthesia with the patient or authorized representative who has indicated his/her understanding and acceptance.     Dental Advisory Given  Plan Discussed with: CRNA  Anesthesia Plan Comments:         Anesthesia Quick Evaluation

## 2023-09-06 NOTE — Op Note (Signed)
 [facility] Gastroenterology Patient Name: Lee Lucas Procedure Date: 09/06/2023 7:04 AM MRN: [id number] Account #: [id number] Date of Birth: 07/23/1960 Admit Type: Outpatient Age: 63 Room: [facility] ENDO ROOM 2 Gender: Male Note Status: Finalized Instrument Name: Colon Scope [id number] Procedure:             Colonoscopy Indications:           Screening for colorectal malignant neoplasm Providers:             Yeilyn Gent K. Aundria MD, MD Referring MD:          Darice Petty (Referring MD) Medicines:             Propofol  per Anesthesia Complications:         No immediate complications. Estimated blood loss: None. Procedure:             Pre-Anesthesia Assessment:                        - The risks and benefits of the procedure and the                         sedation options and risks were discussed with the                         patient. All questions were answered and informed                         consent was obtained.                        - Patient identification and proposed procedure were                         verified prior to the procedure by the nurse. The                         procedure was verified in the procedure room.                        - ASA Grade Assessment: III - A patient with severe                         systemic disease.                        - After reviewing the risks and benefits, the patient                         was deemed in satisfactory condition to undergo the                         procedure.                        After obtaining informed consent, the colonoscope was                         passed under direct vision. Throughout the procedure,  the patient's blood pressure, pulse, and oxygen                         saturations were monitored continuously. The                         Colonoscope was introduced through the anus and                         advanced to the the cecum, identified by  appendiceal                         orifice and ileocecal valve. The colonoscopy was                         somewhat difficult due to significant looping.                         Successful completion of the procedure was aided by                         applying abdominal pressure. The patient tolerated the                         procedure well. The quality of the bowel preparation                         was adequate. The ileocecal valve, appendiceal                         orifice, and rectum were photographed. Findings:      The perianal and digital rectal examinations were normal. Pertinent       negatives include normal sphincter tone and no palpable rectal lesions.      Non-bleeding internal hemorrhoids were found during retroflexion. The       hemorrhoids were Grade I (internal hemorrhoids that do not prolapse).      Many medium-mouthed and small-mouthed diverticula were found in the       sigmoid colon.      The exam was otherwise without abnormality. Impression:            - Non-bleeding internal hemorrhoids.                        - Diverticulosis in the sigmoid colon.                        - The examination was otherwise normal.                        - No specimens collected. Recommendation:        - Repeat EGD for Barrett's for surveillance, interval                         to be decided after pathology review.                        - Patient has a contact number available for  emergencies. The signs and symptoms of potential                         delayed complications were discussed with the patient.                         Return to normal activities tomorrow. Written                         discharge instructions were provided to the patient.                        - Resume previous diet.                        - Continue present medications.                        - Repeat colonoscopy in 10 years for screening                          purposes.                        - Return to GI office PRN.                        - The findings and recommendations were discussed with                         the patient. Procedure Code(s):     --- Professional ---                        H9878, Colorectal cancer screening; colonoscopy on                         individual not meeting criteria for high risk Diagnosis Code(s):     --- Professional ---                        K57.30, Diverticulosis of large intestine without                         perforation or abscess without bleeding                        K64.0, First degree hemorrhoids                        Z12.11, Encounter for screening for malignant neoplasm                         of colon CPT copyright 2022 American Medical Association. All rights reserved. The codes documented in this report are preliminary and upon coder review may  be revised to meet current compliance requirements. Ladell MARLA Boss MD, MD 09/06/2023 8:58:40 AM This report has been signed electronically. Number of Addenda: 0 Note Initiated On: 09/06/2023 7:04 AM Scope Withdrawal Time: 0 hours 5 minutes 55 seconds  Total Procedure Duration: 0 hours 12 minutes 28 seconds  Estimated Blood Loss:  Estimated blood loss: none.  [facility]

## 2023-09-06 NOTE — Op Note (Signed)
 [facility] Gastroenterology Patient Name: Lee Lucas Procedure Date: 09/06/2023 7:05 AM MRN: [id number] Account #: [id number] Date of Birth: [birth year] Admit Type: Outpatient Age: 63 Room: [facility] ENDO ROOM 2 Gender: Male Note Status: Finalized Instrument Name: Upper GI Scope [id number] Procedure:             Upper GI endoscopy Indications:           Surveillance for malignancy due to personal history of                         Barrett's esophagus, Gastro-esophageal reflux disease Providers:             Chasin Findling K. Aundria MD, MD Referring MD:          Darice Petty (Referring MD) Medicines:             Propofol  per Anesthesia Complications:         No immediate complications. Estimated blood loss:                         Minimal. Procedure:             Pre-Anesthesia Assessment:                        - The risks and benefits of the procedure and the                         sedation options and risks were discussed with the                         patient. All questions were answered and informed                         consent was obtained.                        - Patient identification and proposed procedure were                         verified prior to the procedure by the nurse. The                         procedure was verified in the procedure room.                        - ASA Grade Assessment: III - A patient with severe                         systemic disease.                        - After reviewing the risks and benefits, the patient                         was deemed in satisfactory condition to undergo the                         procedure.  After obtaining informed consent, the endoscope was                         passed under direct vision. Throughout the procedure,                         the patient's blood pressure, pulse, and oxygen                         saturations were monitored continuously. The Endoscope                          was introduced through the mouth, and advanced to the                         third part of duodenum. The upper GI endoscopy was                         accomplished without difficulty. The patient tolerated                         the procedure well. Findings:      There were esophageal mucosal changes secondary to established       short-segment Barrett's disease present in the distal esophagus. The       maximum longitudinal extent of these mucosal changes was 3 cm in length.       Mucosa was biopsied with a cold forceps for histology in a targeted       manner and in 4 quadrants at intervals of 1 cm in the lower third of the       esophagus. A total of 3 specimen bottles were sent to pathology.       Estimated blood loss was minimal.      The exam of the esophagus was otherwise normal.      The stomach was normal.      The examined duodenum was normal. Impression:            - Esophageal mucosal changes secondary to established                         short-segment Barrett's disease. Biopsied.                        - Normal stomach.                        - Normal examined duodenum. Recommendation:        - Repeat upper endoscopy in 3 - 5 years for                         surveillance.                        - Proceed with colonoscopy Procedure Code(s):     --- Professional ---                        [phone], Esophagogastroduodenoscopy, flexible,                         transoral;  with biopsy, single or multiple Diagnosis Code(s):     --- Professional ---                        K21.9, Gastro-esophageal reflux disease without                         esophagitis                        K22.70, Barrett's esophagus without dysplasia CPT copyright 2022 American Medical Association. All rights reserved. The codes documented in this report are preliminary and upon coder review may  be revised to meet current compliance requirements. Ladell MARLA Boss MD, MD 09/06/2023  8:40:51 AM This report has been signed electronically. Number of Addenda: 0 Note Initiated On: 09/06/2023 7:05 AM Estimated Blood Loss:  Estimated blood loss was minimal.      [facility]

## 2023-09-07 LAB — SURGICAL PATHOLOGY

## 2023-09-28 ENCOUNTER — Ambulatory Visit: Admitting: Nurse Practitioner

## 2023-09-28 ENCOUNTER — Encounter: Payer: Self-pay | Admitting: Nurse Practitioner

## 2023-09-28 VITALS — BP 113/61 | HR 61 | Temp 98.7°F | Ht 71.0 in | Wt 207.8 lb

## 2023-09-28 DIAGNOSIS — E119 Type 2 diabetes mellitus without complications: Secondary | ICD-10-CM

## 2023-09-28 DIAGNOSIS — G8929 Other chronic pain: Secondary | ICD-10-CM

## 2023-09-28 DIAGNOSIS — E1169 Type 2 diabetes mellitus with other specified complication: Secondary | ICD-10-CM | POA: Diagnosis not present

## 2023-09-28 DIAGNOSIS — E7800 Pure hypercholesterolemia, unspecified: Secondary | ICD-10-CM

## 2023-09-28 DIAGNOSIS — E559 Vitamin D deficiency, unspecified: Secondary | ICD-10-CM

## 2023-09-28 DIAGNOSIS — Z7985 Long-term (current) use of injectable non-insulin antidiabetic drugs: Secondary | ICD-10-CM

## 2023-09-28 DIAGNOSIS — M25511 Pain in right shoulder: Secondary | ICD-10-CM

## 2023-09-28 NOTE — Progress Notes (Signed)
 BP 113/61   Pulse 61   Temp 98.7 F (37.1 C) (Oral)   Ht 5' 11 (1.803 m)   Wt 207 lb 12.8 oz (94.3 kg)   SpO2 97%   BMI 28.98 kg/m    Subjective:    Patient ID: Lee Lucas, male    DOB: [birth year], 63 y.o.   MRN: [id number]  HPI: [name] is a 62 y.o. male  Chief Complaint  Patient presents with   Diabetes   Hyperlipidemia   Hypertension   DIABETES Mounjaro -5mg   Metformin  1000mg  Daily.   Doing well with medications.  No side effects.  Last A1c was 5.7%. He is having some diarrhea.  Hypoglycemic episodes:no Polydipsia/polyuria: no Visual disturbance: no Chest pain: no Paresthesias: no Glucose Monitoring: no  Accucheck frequency: Not Checking  Fasting glucose:  Post prandial:  Evening:  Before meals: Taking Insulin ?: no  Long acting insulin :  Short acting insulin : Blood Pressure Monitoring: not checking Retinal Examination: Not up to Date Foot Exam: Up to Date Diabetic Education: Not Completed Pneumovax: Not up to Date Influenza: Not up to Date Aspirin : yes  HYPERTENSION without Chronic Kidney Disease He is tolerating his medications okay.  Hypertension status: controlled  Satisfied with current treatment? yes Duration of hypertension: years BP monitoring frequency:  not checking BP range:  BP medication side effects:  no Medication compliance: excellent compliance Previous BP meds:lisinopril  Aspirin : no Recurrent headaches: no Visual changes: no Palpitations: no Dyspnea: no Chest pain: no Lower extremity edema: no Dizzy/lightheaded: no  Patient states he has been having right shoulder pain.  Ongoing x 5-6 months.  States it has gotten worse recently.  Making it difficult to do his job and sleep.  He has already had a cortisone shot but that has worn off. His other option was surgery.    Relevant past medical, surgical, family and social history reviewed and updated as indicated. Interim medical history since our last visit  reviewed. Allergies and medications reviewed and updated.  Review of Systems  Eyes:  Negative for visual disturbance.  Respiratory:  Negative for chest tightness and shortness of breath.   Cardiovascular:  Negative for chest pain, palpitations and leg swelling.  Endocrine: Negative for polydipsia and polyuria.  Musculoskeletal:        Right shoulder pain  Neurological:  Negative for dizziness, light-headedness, numbness and headaches.    Per HPI unless specifically indicated above     Objective:    BP 113/61   Pulse 61   Temp 98.7 F (37.1 C) (Oral)   Ht 5' 11 (1.803 m)   Wt 207 lb 12.8 oz (94.3 kg)   SpO2 97%   BMI 28.98 kg/m   Wt Readings from Last 3 Encounters:  09/28/23 207 lb 12.8 oz (94.3 kg)  09/06/23 211 lb 3.2 oz (95.8 kg)  05/17/23 232 lb 12.8 oz (105.6 kg)    Physical Exam Vitals and nursing note reviewed.  Constitutional:      General: He is not in acute distress.    Appearance: Normal appearance. He is not ill-appearing, toxic-appearing or diaphoretic.  HENT:     Head: Normocephalic.     Right Ear: External ear normal.     Left Ear: External ear normal.     Nose: Nose normal. No congestion or rhinorrhea.     Mouth/Throat:     Mouth: Mucous membranes are moist.  Eyes:     General:        Right eye: No discharge.  Left eye: No discharge.     Extraocular Movements: Extraocular movements intact.     Conjunctiva/sclera: Conjunctivae normal.     Pupils: Pupils are equal, round, and reactive to light.  Cardiovascular:     Rate and Rhythm: Normal rate and regular rhythm.     Heart sounds: No murmur heard. Pulmonary:     Effort: Pulmonary effort is normal. No respiratory distress.     Breath sounds: Normal breath sounds. No wheezing, rhonchi or rales.  Abdominal:     General: Abdomen is flat. Bowel sounds are normal.  Musculoskeletal:     Cervical back: Normal range of motion and neck supple.  Skin:    General: Skin is warm and dry.      Capillary Refill: Capillary refill takes less than 2 seconds.  Neurological:     General: No focal deficit present.     Mental Status: He is alert and oriented to person, place, and time.  Psychiatric:        Mood and Affect: Mood normal.        Behavior: Behavior normal.        Thought Content: Thought content normal.        Judgment: Judgment normal.     Results for orders placed or performed in visit on 09/06/23  Surgical pathology   Collection Time: 09/06/23 12:00 AM  Result Value Ref Range   SURGICAL PATHOLOGY      SURGICAL PATHOLOGY [facility] [address] [place], [place] [address] Telephone [phone] or [phone] Fax [phone]  REPORT OF SURGICAL PATHOLOGY   Accession #: [id number] Patient Name: Lee Lucas Visit # :   MRN: [id number] Physician: [place], [place] DOB/Age Feb 01, 1960 (Age: 73) Gender: M Collected Date: 09/06/2023 Received Date: 09/06/2023  FINAL DIAGNOSIS       1. Esophagus, biopsy, cbx 39 cm :       - BARRETT MUCOSA, NO DYSPLASIA       2. Esophagus, biopsy, cbx 40 cm :       - BARRETT MUCOSA, NO DYSPLASIA       3. Esophagus, biopsy, cbx 41 cm :       - BARRETT MUCOSA, NO DYSPLASIA       ELECTRONIC SIGNATURE : Sharma M.D., Nupur, Pathologist, Electronic Signature  MICROSCOPIC DESCRIPTION  CASE COMMENTS STAINS USED IN DIAGNOSIS: H&E H&E H&E    CLINICAL HISTORY  SPECIMEN(S) OBTAINED 1. Esophagus, biopsy, Cbx 39 Cm 2. Esophagus, biopsy, Cbx 40 Cm 3. Esophagus, biopsy, Cbx 41 Cm  SPECIM EN COMMENTS: SPECIMEN CLINICAL INFORMATION: 1. Barrett's esophagus, internal hemorrhoids, diverticulosis    Gross Description 1. Received in formalin is a tan, soft tissue fragment that is submitted in toto.Size:  0.3 cm, 1 block submitted. 2. Received in formalin are tan, soft tissue fragments that are submitted in toto.Number:  three,  Size:  0.1-0.4 cm,  1 block 3. Received in formalin  are tan, soft tissue fragments that are submitted in toto.Number:  6,   Size:  0.3 cm, 1 block.mb 09-06-23        Report signed out from the following location(s) [place] [address] [place], [place] [address] CLIA #: [id number]  [facility] [address] [place], [place] [address] CLIA #: [id number]       Assessment & Plan:   Problem List Items Addressed This Visit       Endocrine   Type 2 diabetes mellitus (HCC) -  Primary   Chronic.  Controlled.  Last A1c was 5.7%.  Continue with current medication regimen of Mounjaro  5mg .  Will stop Metformin  due to side effects.  Needs updated eye exam.  Declined pneumonia shot.  Labs ordered today.  Return to clinic in 3 months for reevaluation.  Call sooner if concerns arise.       Relevant Orders   Comp Met (CMET)   HgB A1c   Diabetes mellitus treated with injections of non-insulin  medication (HCC)     Other   Morbid obesity due to excess calories (HCC)   Recommended eating smaller high protein, low fat meals more frequently and exercising 30 mins a day 5 times a week with a goal of 10-15lb weight loss in the next 3 months.   Continue with Mounjaro .      Pure hypercholesterolemia   Chronic.  Controlled.  Continue with current medication regimen of Atorvastatin  daily.  Labs ordered today.  Return to clinic in 6 months for reevaluation.  Call sooner if concerns arise.       Relevant Orders   Lipid Profile   Vitamin D  deficiency   Labs ordered at visit today.  Will make recommendations based on lab results.        Relevant Orders   Vitamin D  (25 hydroxy)   Other Visit Diagnoses       Chronic right shoulder pain       Recommend following up with Ortho for evaluation for surgery.        Follow up plan: Return in about 3 months (around 12/28/2023) for HTN, HLD, DM2 FU.

## 2023-09-28 NOTE — Assessment & Plan Note (Signed)
 Chronic.  Controlled.  Continue with current medication regimen of Atorvastatin daily.  Labs ordered today.  Return to clinic in 6 months for reevaluation.  Call sooner if concerns arise.

## 2023-09-28 NOTE — Assessment & Plan Note (Signed)
 Chronic.  Controlled.  Last A1c was 5.7%.  Continue with current medication regimen of Mounjaro  5mg .  Will stop Metformin  due to side effects.  Needs updated eye exam.  Declined pneumonia shot.  Labs ordered today.  Return to clinic in 3 months for reevaluation.  Call sooner if concerns arise.

## 2023-09-28 NOTE — Assessment & Plan Note (Signed)
 Labs ordered at visit today.  Will make recommendations based on lab results.

## 2023-09-28 NOTE — Assessment & Plan Note (Signed)
 Recommended eating smaller high protein, low fat meals more frequently and exercising 30 mins a day 5 times a week with a goal of 10-15lb weight loss in the next 3 months.  Continue with Mounjaro.

## 2023-10-10 ENCOUNTER — Ambulatory Visit: Payer: Self-pay

## 2023-10-10 NOTE — Telephone Encounter (Signed)
 FYI Only or Action Required?: Action required by provider: request for appointment and clinical question for provider.  Patient was last seen in primary care on 09/28/2023 by Melvin Pao, NP.  Called Nurse Triage reporting Shoulder Pain.  Symptoms began several days ago.  Interventions attempted: OTC medications: ibuprofen.  Symptoms are: gradually worsening.  Triage Disposition: See Physician Within 24 Hours  Patient/caregiver understands and will follow disposition?: No, wishes to speak with PCP  Copied from CRM #[id number]. Topic: Clinical - Red Word Triage >> Oct 10, 2023  1:27 PM Fonda T wrote: [facility] that prompted transfer to Nurse Triage: Patient calling, reports he is having increased and worsening right shoulder pain, under armpit, shooting down to elbow.   Patient reports he has taking medications over the counter, ibuprofen and is not helping. Reason for Disposition  [1] Unable to use arm at all AND [2] because of shoulder pain or stiffness  Answer Assessment - Initial Assessment Questions No available appts with PCP, offered alternative provider, pt declined. Pt reports will contact orthopedic surgeon. Advised UC/ED today.  1. ONSET: When did the pain start?     1 and half ago week 2. LOCATION: Where is the pain located?     Should pain, shooting down to elbow 3. PAIN: How bad is the pain? (Scale 1-10; or mild, moderate, severe)     10/10; ibuprofen doesn't help, don't want to take Tramadol  4. WORK OR EXERCISE: Has there been any recent work or exercise that involved this part of the body?     Worsening symptoms, suppose to have surgery in November, but trying to wait until next year. Been getting cortisone injection. 5. CAUSE: What do you think is causing the shoulder pain?     Flare up 6. OTHER SYMPTOMS: Do you have any other symptoms? (e.g., neck pain, swelling, rash, fever, numbness, weakness)     Denies swelling, numbness,weakness  Protocols  used: Shoulder Pain-A-AH

## 2023-12-15 DIAGNOSIS — M19011 Primary osteoarthritis, right shoulder: Secondary | ICD-10-CM | POA: Diagnosis not present

## 2023-12-15 DIAGNOSIS — M7581 Other shoulder lesions, right shoulder: Secondary | ICD-10-CM | POA: Diagnosis not present

## 2023-12-15 NOTE — Progress Notes (Signed)
 Orthopaedic New Shoulder Visit  HPI:   Lee Lucas is a 63 y.o. male who presents today as a result of a referral from [place], [place] for right shoulder pain.    The patient's symptoms began over a year ago and developed without any specific cause or injury.  Initially, he saw Sidra Koyanagi, PA-C, nearly 6 months ago who gave the patient a steroid injection into the right glenohumeral joint.  This provided little if any relief of his symptoms.  Over the past month or so, the patient's symptoms have worsened, prompting him to make this appointment.  The patient describes the symptoms as marked (major pain with significant limitations) and have the quality of being aching, constant, miserable, nagging, stabbing, tender, and throbbing.  The pain is localized to the lateral arm/shoulder and localized to the anterior shoulder.  These symptoms are aggravated with normal daily activities, with sleeping, carrying heavy objects, at higher levels of activity, with overhead activity, reaching behind the back, and activity in general.  He has tried acetaminophen , non-steroidal anti-inflammatories (Advil and Voltaren gel), and narcotics  with limited benefit.  He has tried rest  and activity modification with limited benefit.  He has tried the one injection described above, also with no significant substantial relief of his symptoms. In addition, he has been applying heat or heat alternating with ice to the shoulder, again without any substantial relief of his symptoms.  He continues to perform full duties at work, but has been trying to modify his lifting in order to minimize aggravating his symptoms.  The patient denies any neck pain no dizzy noted numbness or paresthesias down his arm to his hand.  He is right-hand dominant, but is status post a reverse left total shoulder arthroplasty 3 years ago from which he has done quite well.  This complaint is not work related.  He is a sports  non-participant.  Shoulder Surgical History:   The patient has had no prior surgery on his right shoulder in the past.  PMH/PSH/Family History/Social History/Meds/Allergies:   I have reviewed past medical, surgical, social and family history, medications and allergies as documented in the EMR.  Current Outpatient Medications  Medication Sig Dispense Refill  . aspirin  81 MG EC tablet Take 81 mg by mouth once daily.    . atorvastatin  (LIPITOR) 40 MG tablet TAKE 1 TABLET BY MOUTH AT BEDTIME 60 tablet 5  . ergocalciferol , vitamin D2, 1,250 mcg (50,000 unit) capsule Take 1 capsule by mouth once a week 12 capsule 0  . ibuprofen (MOTRIN) 200 MG tablet Take 200 mg by mouth every 6 (six) hours as needed for Pain    . lisinopriL  (ZESTRIL ) 10 MG tablet Take 1 tablet by mouth once daily 90 tablet 3  . metFORMIN  (GLUCOPHAGE ) 1000 MG tablet TAKE 1 TABLET BY MOUTH TWICE A DAY WITH MEALS 180 tablet 1  . pantoprazole  (PROTONIX ) 20 MG DR tablet Take 1 tablet (20 mg total) by mouth once daily 90 tablet 3  . peg-electrolyte (NULYTELY) solution Use as directed. 4000 mL 0  . tirzepatide  (MOUNJARO ) 5 mg/0.5 mL pen injector Inject 0.5 mLs (5 mg total) subcutaneously once a week 2 mL 5   No current facility-administered medications for this visit.   No Known Allergies Past Medical History:  Diagnosis Date  . Allergic state   . Cancer (CMS/HHS-HCC)    testicle  . Chickenpox   . GERD (gastroesophageal reflux disease)   . Heartburn 07/17/2013  . Hypertension   .  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 Foot surgery Left 2014  . COLONOSCOPY  08/05/2013   Hyperplastic Polyp: CBF 07/2023  . EGD  08/05/2013   Barrett's Esophagus: CBF 07/2016; Recall Ltr mailed 06/24/2016 (dw)  . impingement/tendinopathy with partial-thickness rotator cuff tear, biceps tendinopathy, and significant degenerative joint disease, left shoulder  Left 01/28/2015   Dr.Poggi    . REPAIR ROTATOR CUFF TEAR ACUTE OPEN Left 2018  . 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 Left 08/27/2020   Dr. Edie  . COLON@[facility]   09/06/2023   Normal/Repeat53yrs/TKT  . EGD@[facility]   09/06/2023   BarrettsEsophagus/Repeat36yrs/TKT  . Right testicle removed    . TONSILLECTOMY      Family History  Problem Relation Name Age of Onset  . No Known Problems Mother    . Heart failure Father    . Colon cancer Father      Social History   Socioeconomic History  . Marital status: Single  Tobacco Use  . Smoking status: Former    Current packs/day: 0.00    Average packs/day: 1.5 packs/day for 18.0 years (27.0 ttl pk-yrs)    Types: Cigarettes    Start date: 02/11/1982    Quit date: 02/12/2000    Years since quitting: 23.8  . Smokeless tobacco: Never  . Tobacco comments:    Quit 2000  Vaping Use  . Vaping status: Never Used  Substance and Sexual Activity  . Alcohol use: Not Currently    Alcohol/week: 0.0 standard drinks of alcohol    Comment: Socially  . Drug use: Defer  . Sexual activity: Defer   Social Drivers of Health   Financial Resource Strain: Low Risk  (07/07/2023)   Overall Financial Resource Strain (CARDIA)   . Difficulty of Paying Living Expenses: Not hard at all  Food Insecurity: No Food Insecurity (07/07/2023)   Hunger Vital Sign   . Worried About Programme Researcher, Broadcasting/film/video in the Last Year: Never true   . Ran Out of Food in the Last Year: Never true  Transportation Needs: No Transportation Needs (07/07/2023)   PRAPARE - Transportation   . Lack of Transportation (Medical): No   . Lack of Transportation (Non-Medical): No    Review of Systems:   A comprehensive 14 point ROS was performed, reviewed, and the pertinent orthopaedic findings are documented in the HPI.  Physical Exam:    Vitals:   12/15/23 1036  BP: 122/78  Weight: 93.9 kg (207 lb)  Height: 180.3 cm (5' 11)  PainSc: 10-Worst pain ever  PainLoc: Shoulder   General/Constitutional:  The patient appears to be well-nourished, well-developed, and in no acute distress. Neuro/Psych: Normal mood and affect, oriented to person, place and time. Eyes: Non-icteric.  Pupils are equal, round, and reactive to light, and exhibit synchronous movement. ENT: Unremarkable. Lymphatic: No palpable adenopathy. Respiratory: No wheezes and Non-labored breathing Cardiovascular: No edema, swelling or tenderness, except as noted in detailed exam. Integumentary: No impressive skin lesions present, except as noted in detailed exam. Musculoskeletal: Unremarkable, except as noted in detailed exam.  Right shoulder exam: SKIN: normal SWELLING: none WARMTH: none LYMPH NODES: no adenopathy palpable CREPITUS: Mild glenohumeral crepitance TENDERNESS: Mild-moderate tenderness over the anterolateral shoulder ROM (active):      Forward flexion: 80 degrees    Abduction: 65 degrees    Internal rotation: Right buttock ROM (passive):      Forward flexion: 110 degrees    Abduction: 95  ER/IR at 90 abd: 60 degrees / 40 degrees  He experiences moderate-severe pain with all motions.  STRENGTH:  Forward flexion: 4/5   Abduction: 4/5   External rotation: 4/5   Internal rotation: 4-4+/5   Pain with RC testing: Moderate pain with resisted strength testing in all planes  STABILITY: Normal  SPECIAL TESTS:  Vonzell' test: positive, moderate    Speed's test: positive    Capsulitis - pain w/ passive ER: no    Crossed arm test: Mildly positive    Crank: Not evaluated    Anterior apprehension: Negative    Posterior apprehension: Not evaluated  He is neurovascularly intact to the right upper extremity.  Imaging:   Shoulder X-Ray Imaging: Recent true AP, Y-scapular, and AP views of the right shoulder are available for review and have been reviewed by myself.  These films demonstrate significant degenerative changes with complete loss of the glenohumeral joint space, as well as a large inferior humeral  osteophyte.  The subacromial space is moderately decreased.  There is no subacromial or infra-clavicular spurring.  He demonstrates a Type II acromion.  Assessment:    Encounter Diagnoses  Name Primary?  . Primary osteoarthritis of right shoulder Yes  . Rotator cuff tendinitis, right     Plan:   The treatment options were discussed with the patient and his fiance.  In addition, patient educational materials were provided regarding the diagnosis and treatment options.  The patient is quite frustrated by his symptoms and functional limitations, and is ready to consider more aggressive treatment options.  Therefore, I have recommended a surgical procedure, specifically a reverse right total shoulder arthroplasty, especially given how well he has done following his left reverse total shoulder arthroplasty.  The procedure was discussed with the patient, as were the potential risks (including bleeding, infection, nerve and/or blood vessel injury, persistent or recurrent pain, loosening and/or failure of the components, dislocation, need for further surgery, blood clots, strokes, heart attacks and/or arhythmias, pneumonia, etc.) and benefits.  The patient states his understanding and wishes to proceed.  In addition, he will be sent for a CT scan of the right shoulder to help with preoperative planning.  All of the patient's questions and concerns were answered.  He can call any time with further concerns.  He will follow up post-surgery, routine.  This office visit took 30 minutes, of which >50% involved patient counseling/education.

## 2023-12-22 ENCOUNTER — Other Ambulatory Visit: Payer: Self-pay | Admitting: Surgery

## 2023-12-22 DIAGNOSIS — M19011 Primary osteoarthritis, right shoulder: Secondary | ICD-10-CM

## 2023-12-22 DIAGNOSIS — M7581 Other shoulder lesions, right shoulder: Secondary | ICD-10-CM

## 2023-12-29 ENCOUNTER — Ambulatory Visit: Admitting: Nurse Practitioner

## 2023-12-29 ENCOUNTER — Encounter: Payer: Self-pay | Admitting: Nurse Practitioner

## 2023-12-29 DIAGNOSIS — Z7985 Long-term (current) use of injectable non-insulin antidiabetic drugs: Secondary | ICD-10-CM

## 2023-12-29 DIAGNOSIS — I10 Essential (primary) hypertension: Secondary | ICD-10-CM | POA: Diagnosis not present

## 2023-12-29 DIAGNOSIS — E7800 Pure hypercholesterolemia, unspecified: Secondary | ICD-10-CM

## 2023-12-29 DIAGNOSIS — E119 Type 2 diabetes mellitus without complications: Secondary | ICD-10-CM

## 2023-12-29 DIAGNOSIS — E1169 Type 2 diabetes mellitus with other specified complication: Secondary | ICD-10-CM | POA: Diagnosis not present

## 2023-12-29 MED ORDER — ATORVASTATIN CALCIUM 40 MG PO TABS: 40.0000 mg | ORAL_TABLET | Freq: Every day | ORAL | 1 refills | Status: AC

## 2023-12-29 MED ORDER — PANTOPRAZOLE SODIUM 20 MG PO TBEC: 20.0000 mg | DELAYED_RELEASE_TABLET | Freq: Every day | ORAL | 1 refills | Status: AC

## 2023-12-29 MED ORDER — LISINOPRIL 10 MG PO TABS: 10.0000 mg | ORAL_TABLET | Freq: Every day | ORAL | 1 refills | Status: AC

## 2023-12-29 MED ORDER — TIRZEPATIDE 5 MG/0.5ML ~~LOC~~ SOAJ: 5.0000 mg | SUBCUTANEOUS | 1 refills | Status: DC

## 2023-12-29 NOTE — Assessment & Plan Note (Signed)
 Chronic.  Well controlled.  Last A1c was 5.7%.  He is doing well with Mounjaro  5mg . No side effects to report. Continue with Lisinopril  and Atorvastatin  for Cardiorenal protection. Eye exam and foot exam are up to date.  Follow up in 6 months.  Call sooner if concerns arise.

## 2023-12-29 NOTE — Assessment & Plan Note (Signed)
 Chronic.  Controlled.  Continue with current medication regimen of Lisinopril 10mg .  Refills sent today.  Labs ordered today.  Return to clinic in 6 months for reevaluation.  Call sooner if concerns arise.

## 2023-12-29 NOTE — Progress Notes (Signed)
 "  BP 113/67 (BP Location: Left Arm, Patient Position: Sitting, Cuff Size: Normal)   Pulse 71   Temp 98 F (36.7 C) (Oral)   Ht 5' 10.98 (1.803 m)   SpO2 97%   BMI 29.00 kg/m    Subjective:    Patient ID: Lee Lucas, male    DOB: 01-06-1961, 63 y.o.   MRN: [id number]  HPI: Lee Lucas is a 63 y.o. male  Chief Complaint  Patient presents with   office visit    27month F/u.   DIABETES Mounjaro -5mg  Doing well with medications.  No side effects.  Last A1c was 5.7%.  Hypoglycemic episodes:no Polydipsia/polyuria: no Visual disturbance: no Chest pain: no Paresthesias: no Glucose Monitoring: no  Accucheck frequency: Not Checking  Fasting glucose:  Post prandial:  Evening:  Before meals: Taking Insulin ?: no  Long acting insulin :  Short acting insulin : Blood Pressure Monitoring: not checking Retinal Examination: Not up to Date Foot Exam: Up to Date Diabetic Education: Not Completed Pneumovax: Not up to Date Influenza: Not up to Date Aspirin : yes  HYPERTENSION without Chronic Kidney Disease He is tolerating his medications okay.  Hypertension status: controlled  Satisfied with current treatment? yes Duration of hypertension: years BP monitoring frequency:  not checking BP range:  BP medication side effects:  no Medication compliance: excellent compliance Previous BP meds:lisinopril  Aspirin : no Recurrent headaches: no Visual changes: no Palpitations: no Dyspnea: no Chest pain: no Lower extremity edema: no Dizzy/lightheaded: no  Patient states he will have shoulder surgery January 27.  He will have a total shoulder replacement with Dr. Edie.    Relevant past medical, surgical, family and social history reviewed and updated as indicated. Interim medical history since our last visit reviewed. Allergies and medications reviewed and updated.  Review of Systems  Eyes:  Negative for visual disturbance.  Respiratory:  Negative for chest tightness and  shortness of breath.   Cardiovascular:  Negative for chest pain, palpitations and leg swelling.  Endocrine: Negative for polydipsia and polyuria.  Musculoskeletal:        Right shoulder pain  Neurological:  Negative for dizziness, light-headedness, numbness and headaches.    Per HPI unless specifically indicated above     Objective:    BP 113/67 (BP Location: Left Arm, Patient Position: Sitting, Cuff Size: Normal)   Pulse 71   Temp 98 F (36.7 C) (Oral)   Ht 5' 10.98 (1.803 m)   SpO2 97%   BMI 29.00 kg/m   Wt Readings from Last 3 Encounters:  09/28/23 207 lb 12.8 oz (94.3 kg)  09/06/23 211 lb 3.2 oz (95.8 kg)  05/17/23 232 lb 12.8 oz (105.6 kg)    Physical Exam Vitals and nursing note reviewed.  Constitutional:      General: He is not in acute distress.    Appearance: Normal appearance. He is not ill-appearing, toxic-appearing or diaphoretic.  HENT:     Head: Normocephalic.     Right Ear: External ear normal.     Left Ear: External ear normal.     Nose: Nose normal. No congestion or rhinorrhea.     Mouth/Throat:     Mouth: Mucous membranes are moist.  Eyes:     General:        Right eye: No discharge.        Left eye: No discharge.     Extraocular Movements: Extraocular movements intact.     Conjunctiva/sclera: Conjunctivae normal.     Pupils: Pupils are equal, round, and  reactive to light.  Cardiovascular:     Rate and Rhythm: Normal rate and regular rhythm.     Heart sounds: No murmur heard. Pulmonary:     Effort: Pulmonary effort is normal. No respiratory distress.     Breath sounds: Normal breath sounds. No wheezing, rhonchi or rales.  Abdominal:     General: Abdomen is flat. Bowel sounds are normal.  Musculoskeletal:     Cervical back: Normal range of motion and neck supple.  Skin:    General: Skin is warm and dry.     Capillary Refill: Capillary refill takes less than 2 seconds.  Neurological:     General: No focal deficit present.     Mental Status:  He is alert and oriented to person, place, and time.  Psychiatric:        Mood and Affect: Mood normal.        Behavior: Behavior normal.        Thought Content: Thought content normal.        Judgment: Judgment normal.     Results for orders placed or performed in visit on 09/06/23  Surgical pathology   Collection Time: 09/06/23 12:00 AM  Result Value Ref Range   SURGICAL PATHOLOGY      SURGICAL PATHOLOGY [facility] [address] [place], [place] [address] Telephone [phone] or [phone] Fax [phone]  REPORT OF SURGICAL PATHOLOGY   Accession #: [id number] Patient Name: Lee Lucas, Lee Lucas Visit # :   MRN: [id number] Physician: [place], [place] DOB/Age [birth year] (Age: 81) Gender: M Collected Date: 09/06/2023 Received Date: 09/06/2023  FINAL DIAGNOSIS       1. Esophagus, biopsy, cbx 39 cm :       - BARRETT MUCOSA, NO DYSPLASIA       2. Esophagus, biopsy, cbx 40 cm :       - BARRETT MUCOSA, NO DYSPLASIA       3. Esophagus, biopsy, cbx 41 cm :       - BARRETT MUCOSA, NO DYSPLASIA       ELECTRONIC SIGNATURE : Sharma M.D., Nupur, Pathologist, Electronic Signature  MICROSCOPIC DESCRIPTION  CASE COMMENTS STAINS USED IN DIAGNOSIS: H&E H&E H&E    CLINICAL HISTORY  SPECIMEN(S) OBTAINED 1. Esophagus, biopsy, Cbx 39 Cm 2. Esophagus, biopsy, Cbx 40 Cm 3. Esophagus, biopsy, Cbx 41 Cm  SPECIM EN COMMENTS: SPECIMEN CLINICAL INFORMATION: 1. Barrett's esophagus, internal hemorrhoids, diverticulosis    Gross Description 1. Received in formalin is a tan, soft tissue fragment that is submitted in toto.Size:  0.3 cm, 1 block submitted. 2. Received in formalin are tan, soft tissue fragments that are submitted in toto.Number:  three,  Size:  0.1-0.4 cm,  1 block 3. Received in formalin are tan, soft tissue fragments that are submitted in toto.Number:  6,   Size:  0.3 cm, 1 block.mb 09-06-23        Report signed out  from the following location(s) [place] [address] [place], [place] [address] CLIA #: [id number]  [facility] [address] [place], [place] [address] CLIA #: [id number]       Assessment & Plan:   Problem List Items Addressed This Visit       Cardiovascular and Mediastinum   Hypertension     Endocrine   Type 2 diabetes mellitus (HCC) - Primary   Diabetes mellitus treated with injections of non-insulin  medication (HCC)     Other  Morbid obesity due to excess calories (HCC)   Pure hypercholesterolemia     Follow up plan: No follow-ups on file.      "

## 2023-12-29 NOTE — Assessment & Plan Note (Signed)
 Recommended eating smaller high protein, low fat meals more frequently and exercising 30 mins a day 5 times a week with a goal of 10-15lb weight loss in the next 3 months.

## 2023-12-29 NOTE — Assessment & Plan Note (Signed)
Chronic.  Controlled.  Continue with current medication regimen of Atorvastatin daily.  Refills sent today.  Labs ordered today.  Return to clinic in 6 months for reevaluation.  Call sooner if concerns arise.    

## 2023-12-30 LAB — COMPREHENSIVE METABOLIC PANEL WITH GFR
ALT: 21 IU/L (ref 0–44)
AST: 14 IU/L (ref 0–40)
Albumin: 4 g/dL (ref 3.9–4.9)
Alkaline Phosphatase: 132 IU/L — ABNORMAL HIGH (ref 47–123)
BUN/Creatinine Ratio: 13 (ref 10–24)
BUN: 10 mg/dL (ref 8–27)
Bilirubin Total: 0.4 mg/dL (ref 0.0–1.2)
CO2: 27 mmol/L (ref 20–29)
Calcium: 9.3 mg/dL (ref 8.6–10.2)
Chloride: 102 mmol/L (ref 96–106)
Creatinine, Ser: 0.8 mg/dL (ref 0.76–1.27)
Globulin, Total: 2 g/dL (ref 1.5–4.5)
Glucose: 97 mg/dL (ref 70–99)
Potassium: 4.1 mmol/L (ref 3.5–5.2)
Sodium: 138 mmol/L (ref 134–144)
Total Protein: 6 g/dL (ref 6.0–8.5)
eGFR: 100 mL/min/1.73

## 2023-12-30 LAB — HEMOGLOBIN A1C
Est. average glucose Bld gHb Est-mCnc: 108 mg/dL
Hgb A1c MFr Bld: 5.4 % (ref 4.8–5.6)

## 2023-12-30 LAB — CBC WITH DIFF/PLATELET
Basophils Absolute: 0 x10E3/uL (ref 0.0–0.2)
Basos: 0 %
EOS (ABSOLUTE): 0.2 x10E3/uL (ref 0.0–0.4)
Eos: 3 %
Hematocrit: 41.5 % (ref 37.5–51.0)
Hemoglobin: 13.8 g/dL (ref 13.0–17.7)
Immature Grans (Abs): 0 x10E3/uL (ref 0.0–0.1)
Immature Granulocytes: 0 %
Lymphocytes Absolute: 1.4 x10E3/uL (ref 0.7–3.1)
Lymphs: 20 %
MCH: 32.5 pg (ref 26.6–33.0)
MCHC: 33.3 g/dL (ref 31.5–35.7)
MCV: 98 fL — ABNORMAL HIGH (ref 79–97)
Monocytes Absolute: 0.5 x10E3/uL (ref 0.1–0.9)
Monocytes: 7 %
Neutrophils Absolute: 4.9 x10E3/uL (ref 1.4–7.0)
Neutrophils: 70 %
Platelets: 220 x10E3/uL (ref 150–450)
RBC: 4.24 x10E6/uL (ref 4.14–5.80)
RDW: 12.4 % (ref 11.6–15.4)
WBC: 7.1 x10E3/uL (ref 3.4–10.8)

## 2023-12-30 LAB — LIPID PANEL
Chol/HDL Ratio: 2.4 ratio (ref 0.0–5.0)
Cholesterol, Total: 107 mg/dL (ref 100–199)
HDL: 44 mg/dL
LDL Chol Calc (NIH): 45 mg/dL (ref 0–99)
Triglycerides: 93 mg/dL (ref 0–149)
VLDL Cholesterol Cal: 18 mg/dL (ref 5–40)

## 2024-01-01 ENCOUNTER — Ambulatory Visit: Payer: Self-pay | Admitting: Nurse Practitioner

## 2024-01-09 ENCOUNTER — Ambulatory Visit

## 2024-01-12 ENCOUNTER — Ambulatory Visit
Admission: RE | Admit: 2024-01-12 | Discharge: 2024-01-12 | Disposition: A | Discharge status: Home / Self Care | Source: Ambulatory Visit | Attending: Surgery | Admitting: Surgery

## 2024-01-12 DIAGNOSIS — M19011 Primary osteoarthritis, right shoulder: Secondary | ICD-10-CM | POA: Diagnosis present

## 2024-01-12 DIAGNOSIS — M7581 Other shoulder lesions, right shoulder: Secondary | ICD-10-CM | POA: Insufficient documentation

## 2024-01-22 ENCOUNTER — Other Ambulatory Visit: Payer: Self-pay | Admitting: Surgery

## 2024-01-30 ENCOUNTER — Encounter
Admission: RE | Admit: 2024-01-30 | Discharge: 2024-01-30 | Disposition: A | Discharge status: Home / Self Care | Source: Ambulatory Visit | Attending: Surgery | Admitting: Surgery

## 2024-01-30 ENCOUNTER — Other Ambulatory Visit: Payer: Self-pay

## 2024-01-30 VITALS — Ht 72.0 in | Wt 204.0 lb

## 2024-01-30 DIAGNOSIS — Z7985 Long-term (current) use of injectable non-insulin antidiabetic drugs: Secondary | ICD-10-CM

## 2024-01-30 DIAGNOSIS — E1169 Type 2 diabetes mellitus with other specified complication: Secondary | ICD-10-CM

## 2024-01-30 DIAGNOSIS — Z01812 Encounter for preprocedural laboratory examination: Secondary | ICD-10-CM

## 2024-01-30 HISTORY — DX: Pure hypercholesterolemia, unspecified: E78.00

## 2024-01-30 HISTORY — DX: Long-term (current) use of injectable non-insulin antidiabetic drugs: Z79.85

## 2024-01-30 HISTORY — DX: Morbid (severe) obesity due to excess calories: E66.01

## 2024-01-30 HISTORY — DX: Essential (primary) hypertension: I10

## 2024-01-30 NOTE — Patient Instructions (Addendum)
 Your procedure is scheduled on: Tuesday 02/06/24 Report to the Registration Desk on the 1st floor of the [facility]. To find out your arrival time, please call [phone] between 1PM - 3PM on: Monday 02/05/24 If your arrival time is 6:00 am, do not arrive before that time as the [facility] entrance doors do not open until 6:00 am.  REMEMBER: Instructions that are not followed completely may result in serious medical risk, up to and including death; or upon the discretion of your surgeon and anesthesiologist your surgery may need to be rescheduled.  Do not eat food after midnight the night before surgery.  No gum chewing or hard candies.  You may however, drink CLEAR liquids up to 2 hours before you are scheduled to arrive for your surgery. Do not drink anything within 2 hours of your scheduled arrival time.  Clear liquids include: - water   Do NOT drink anything that is not on this list.  **Type 1 and Type 2 diabetics should only drink water .**  In addition, your doctor has ordered for you to drink the provided:  Gatorade G2 Drinking this carbohydrate drink up to two hours before surgery helps to reduce insulin  resistance and improve patient outcomes. Please complete drinking 2 hours before scheduled arrival time.  One week prior to surgery: Stop Anti-inflammatories (NSAIDS) such as Advil, Aleve, Ibuprofen, Motrin, Naproxen, Naprosyn and Aspirin  based products such as Excedrin, Goody's Powder, BC Powder. Stop ANY OVER THE COUNTER supplements until after surgery. Cyanocobalamin  (VITAMIN B-12  Vitamin D ,  You may however, continue to take Tylenol  if needed for pain up until the day of surgery.  Stop tirzepatide  (MOUNJARO ) 5 MG/0.5ML Pen 7 days prior to surgery. (Took last dose 01/28/24)  Continue taking all of your other prescription medications up until the day of surgery.  ON THE DAY OF SURGERY ONLY TAKE THESE MEDICATIONS WITH SIPS OF WATER :  pantoprazole  (PROTONIX ) 20 MG    No Alcohol for 24 hours before or after surgery.  No Smoking including e-cigarettes for 24 hours before surgery.  No chewable tobacco products for at least 6 hours before surgery.  No nicotine patches on the day of surgery.  Do not use any recreational drugs for at least a week (preferably 2 weeks) before your surgery.  Please be advised that the combination of cocaine and anesthesia may have negative outcomes, up to and including death. If you test positive for cocaine, your surgery will be cancelled.  On the morning of surgery brush your teeth with toothpaste and water , you may rinse your mouth with mouthwash if you wish. Do not swallow any toothpaste or mouthwash.  Use CHG Soap or wipes as directed on instruction sheet.  Do not wear jewelry, make-up, hairpins, clips or nail polish.  For welded (permanent) jewelry: bracelets, anklets, waist bands, etc.  Please have this removed prior to surgery.  If it is not removed, there is a chance that hospital personnel will need to cut it off on the day of surgery.  Do not wear lotions, powders, or perfumes.   Do not shave body hair from the neck down 48 hours before surgery.  Contact lenses, hearing aids and dentures may not be worn into surgery.  Do not bring valuables to the hospital. [facility] is not responsible for any missing/lost belongings or valuables.   Total Shoulder Arthroplasty:  use Benzoyl Peroxide 5% Gel as directed on instruction sheet.  Notify your doctor if there is any change in your medical condition (  cold, fever, infection).  Wear comfortable clothing (specific to your surgery type) to the hospital.  After surgery, you can help prevent lung complications by doing breathing exercises.  Take deep breaths and cough every 1-2 hours. Your doctor may order a device called an Incentive Spirometer to help you take deep breaths.  If you are being admitted to the hospital overnight, leave your suitcase in the car.  After surgery it may be brought to your room.  In case of increased patient census, it may be necessary for you, the patient, to continue your postoperative care in the Same Day Surgery department.  If you are being discharged the day of surgery, you will not be allowed to drive home. You will need a responsible individual to drive you home and stay with you for 24 hours after surgery.   If you are taking public transportation, you will need to have a responsible individual with you.  Please call the Pre-admissions Testing Dept. at [phone] if you have any questions about these instructions.  Surgery Visitation Policy:  Patients having surgery or a procedure may have two visitors.  Children under the age of 74 must have an adult with them who is not the patient.  Inpatient Visitation:    Visiting hours are 7 a.m. to 8 p.m. Up to four visitors are allowed at one time in a patient room. The visitors may rotate out with other people during the day.  One visitor age 89 or older may stay with the patient overnight and must be in the room by 8 p.m.  Merchandiser, Retail to address health-related social needs:  [email or web address]  Preparing for Total Shoulder Arthroplasty  Before surgery, you can play an important role by reducing the number of germs on your skin by using the following products:  Benzoyl Peroxide Gel  o Reduces the number of germs present on the skin  o Applied twice a day to shoulder area starting two days before surgery  Chlorhexidine  Gluconate (CHG) Soap  o An antiseptic cleaner that kills germs and bonds with the skin to continue killing germs even after washing  o Used for showering the night before surgery and morning of surgery  BENZOYL PEROXIDE 5% GEL                               Please do not use if you have an allergy to benzoyl peroxide. If your skin becomes reddened/irritated stop using the benzoyl peroxide.  Starting two  days before surgery, apply as follows:  1. Apply benzoyl peroxide in the morning and at night. Apply after taking a shower. If you are not taking a shower, clean entire shoulder front, back, and side along with the armpit with a clean wet washcloth.  2. Place a quarter-sized dollop on your shoulder and rub in thoroughly, making sure to cover the front, back, and side of your shoulder, along with the armpit.                                                 Sunday  02/04/24                      Monday  02/05/24       2 days before ____ AM ____  PM 1 day before ____ AM ____ PM  3. Do this twice a day for two days. (Last application is the night before surgery, AFTER using the CHG soap).  4. Do NOT apply benzoyl peroxide gel on the day of surgery.    Pre-operative 4 CHG Bath Instructions   You can play a key role in reducing the risk of infection after surgery. Your skin needs to be as free of germs as possible. You can reduce the number of germs on your skin by washing with CHG (chlorhexidine  gluconate) soap before surgery. CHG is an antiseptic soap that kills germs and continues to kill germs even after washing.   DO NOT use if you have an allergy to chlorhexidine /CHG or antibacterial soaps. If your skin becomes reddened or irritated, stop using the CHG and notify one of our RNs at [phone].   Please shower with the CHG soap starting 4 days before surgery using the following schedule:     Please keep in mind the following:  DO NOT shave, including legs and underarms, starting the day of your first shower.   You may shave your face at any point before/day of surgery.  Place clean sheets on your bed the day you start using CHG soap. Use a clean washcloth (not used since being washed) for each shower. DO NOT sleep with pets once you start using the CHG.   CHG Shower Instructions:  If you choose to wash your hair and private area, wash first with your normal shampoo/soap.  After you use  shampoo/soap, rinse your hair and body thoroughly to remove shampoo/soap residue.  Turn the water  OFF and apply about 3 tablespoons (45 ml) of CHG soap to a CLEAN washcloth.  Apply CHG soap ONLY FROM YOUR NECK DOWN TO YOUR TOES (washing for 3-5 minutes)  DO NOT use CHG soap on face, private areas, open wounds, or sores.  Pay special attention to the area where your surgery is being performed.  If you are having back surgery, having someone wash your back for you may be helpful. Wait 2 minutes after CHG soap is applied, then you may rinse off the CHG soap.  Pat dry with a clean towel  Put on clean clothes/pajamas   If you choose to wear lotion, please use ONLY the CHG-compatible lotions on the back of this paper.     Additional instructions for the day of surgery: DO NOT APPLY any lotions, deodorants, cologne, or perfumes.   Put on clean/comfortable clothes.  Brush your teeth.  Ask your nurse before applying any prescription medications to the skin.      CHG Compatible Lotions   Aveeno Moisturizing lotion  Cetaphil Moisturizing Cream  Cetaphil Moisturizing Lotion  Clairol Herbal Essence Moisturizing Lotion, Dry Skin  Clairol Herbal Essence Moisturizing Lotion, Extra Dry Skin  Clairol Herbal Essence Moisturizing Lotion, Normal Skin  Curel Age Defying Therapeutic Moisturizing Lotion with Alpha Hydroxy  Curel Extreme Care Body Lotion  Curel Soothing Hands Moisturizing Hand Lotion  Curel Therapeutic Moisturizing Cream, Fragrance-Free  Curel Therapeutic Moisturizing Lotion, Fragrance-Free  Curel Therapeutic Moisturizing Lotion, Original Formula  Eucerin Daily Replenishing Lotion  Eucerin Dry Skin Therapy Plus Alpha Hydroxy Crme  Eucerin Dry Skin Therapy Plus Alpha Hydroxy Lotion  Eucerin Original Crme  Eucerin Original Lotion  Eucerin Plus Crme Eucerin Plus Lotion  Eucerin TriLipid Replenishing Lotion  Keri Anti-Bacterial Hand Lotion  Keri Deep Conditioning Original Lotion Dry  Skin Formula Softly Scented  Keri Deep Conditioning Original Lotion,  Fragrance Free Sensitive Skin Formula  Keri Lotion Fast Absorbing Fragrance Free Sensitive Skin Formula  Keri Lotion Fast Absorbing Softly Scented Dry Skin Formula  Keri Original Lotion  Keri Skin Renewal Lotion Keri Silky Smooth Lotion  Keri Silky Smooth Sensitive Skin Lotion  Nivea Body Creamy Conditioning Oil  Nivea Body Extra Enriched Lotion  Nivea Body Original Lotion  Nivea Body Sheer Moisturizing Lotion Nivea Crme  Nivea Skin Firming Lotion  NutraDerm 30 Skin Lotion  NutraDerm Skin Lotion  NutraDerm Therapeutic Skin Cream  NutraDerm Therapeutic Skin Lotion  ProShield Protective Hand Cream  Provon moisturizing lotion  How to Use an Incentive Spirometer  An incentive spirometer is a tool that measures how well you are filling your lungs with each breath. Learning to take long, deep breaths using this tool can help you keep your lungs clear and active. This may help to reverse or lessen your chance of developing breathing (pulmonary) problems, especially infection. You may be asked to use a spirometer: After a surgery. If you have a lung problem or a history of smoking. After a long period of time when you have been unable to move or be active. If the spirometer includes an indicator to show the highest number that you have reached, your health care provider or respiratory therapist will help you set a goal. Keep a log of your progress as told by your health care provider. What are the risks? Breathing too quickly may cause dizziness or cause you to pass out. Take your time so you do not get dizzy or light-headed. If you are in pain, you may need to take pain medicine before doing incentive spirometry. It is harder to take a deep breath if you are having pain. How to use your incentive spirometer  Sit up on the edge of your bed or on a chair. Hold the incentive spirometer so that it is in an upright  position. Before you use the spirometer, breathe out normally. Place the mouthpiece in your mouth. Make sure your lips are closed tightly around it. Breathe in slowly and as deeply as you can through your mouth, causing the piston or the ball to rise toward the top of the chamber. Hold your breath for 3-5 seconds, or for as long as possible. If the spirometer includes a coach indicator, use this to guide you in breathing. Slow down your breathing if the indicator goes above the marked areas. Remove the mouthpiece from your mouth and breathe out normally. The piston or ball will return to the bottom of the chamber. Rest for a few seconds, then repeat the steps 10 or more times. Take your time and take a few normal breaths between deep breaths so that you do not get dizzy or light-headed. Do this every 1-2 hours when you are awake. If the spirometer includes a goal marker to show the highest number you have reached (best effort), use this as a goal to work toward during each repetition. After each set of 10 deep breaths, cough a few times. This will help to make sure that your lungs are clear. If you have an incision on your chest or abdomen from surgery, place a pillow or a rolled-up towel firmly against the incision when you cough. This can help to reduce pain while taking deep breaths and coughing. General tips When you are able to get out of bed: Walk around often. Continue to take deep breaths and cough in order to clear your lungs. Keep using the  incentive spirometer until your health care provider says it is okay to stop using it. If you have been in the hospital, you may be told to keep using the spirometer at home. Contact a health care provider if: You are having difficulty using the spirometer. You have trouble using the spirometer as often as instructed. Your pain medicine is not giving enough relief for you to use the spirometer as told. You have a fever. Get help right away  if: You develop shortness of breath. You develop a cough with bloody mucus from the lungs. You have fluid or blood coming from an incision site after you cough. Summary An incentive spirometer is a tool that can help you learn to take long, deep breaths to keep your lungs clear and active. You may be asked to use a spirometer after a surgery, if you have a lung problem or a history of smoking, or if you have been inactive for a long period of time. Use your incentive spirometer as instructed every 1-2 hours while you are awake. If you have an incision on your chest or abdomen, place a pillow or a rolled-up towel firmly against your incision when you cough. This will help to reduce pain. Get help right away if you have shortness of breath, you cough up bloody mucus, or blood comes from your incision when you cough. This information is not intended to replace advice given to you by your health care provider. Make sure you discuss any questions you have with your health care provider.

## 2024-02-02 ENCOUNTER — Encounter
Admission: RE | Admit: 2024-02-02 | Discharge: 2024-02-02 | Disposition: A | Source: Ambulatory Visit | Attending: Surgery

## 2024-02-02 DIAGNOSIS — Z01810 Encounter for preprocedural cardiovascular examination: Secondary | ICD-10-CM | POA: Diagnosis present

## 2024-02-02 DIAGNOSIS — Z01812 Encounter for preprocedural laboratory examination: Secondary | ICD-10-CM

## 2024-02-02 DIAGNOSIS — Z01818 Encounter for other preprocedural examination: Secondary | ICD-10-CM | POA: Diagnosis not present

## 2024-02-02 LAB — URINALYSIS, ROUTINE W REFLEX MICROSCOPIC
Bilirubin Urine: NEGATIVE
Glucose, UA: NEGATIVE mg/dL
Hgb urine dipstick: NEGATIVE
Ketones, ur: NEGATIVE mg/dL
Leukocytes,Ua: NEGATIVE
Nitrite: NEGATIVE
Protein, ur: NEGATIVE mg/dL
Specific Gravity, Urine: 1.014 (ref 1.005–1.030)
pH: 6 (ref 5.0–8.0)

## 2024-02-02 LAB — SURGICAL PCR SCREEN
MRSA, PCR: NEGATIVE
Staphylococcus aureus: NEGATIVE

## 2024-02-05 MED ORDER — ORAL CARE MOUTH RINSE: 15.0000 mL | Freq: Once | OROMUCOSAL | Status: AC

## 2024-02-05 MED ORDER — CHLORHEXIDINE GLUCONATE 0.12 % MT SOLN
15.0000 mL | Freq: Once | OROMUCOSAL | Status: AC
  Administered 2024-02-06: 15 mL via OROMUCOSAL

## 2024-02-05 MED ORDER — TRANEXAMIC ACID-NACL 1000-0.7 MG/100ML-% IV SOLN
1000.0000 mg | INTRAVENOUS | Status: AC
  Administered 2024-02-06: 1000 mg via INTRAVENOUS

## 2024-02-05 MED ORDER — CEFAZOLIN SODIUM-DEXTROSE 2-4 GM/100ML-% IV SOLN
2.0000 g | INTRAVENOUS | Status: AC
  Administered 2024-02-06: 2 g via INTRAVENOUS

## 2024-02-05 MED ORDER — SODIUM CHLORIDE 0.9 % IV SOLN: INTRAVENOUS | Status: DC

## 2024-02-06 ENCOUNTER — Ambulatory Visit

## 2024-02-06 ENCOUNTER — Ambulatory Visit
Admission: RE | Admit: 2024-02-06 | Discharge: 2024-02-06 | Disposition: A | Discharge status: Home / Self Care | Attending: Surgery | Admitting: Surgery

## 2024-02-06 ENCOUNTER — Other Ambulatory Visit: Payer: Self-pay

## 2024-02-06 ENCOUNTER — Encounter: Payer: Self-pay | Admitting: Surgery

## 2024-02-06 ENCOUNTER — Encounter
Admission: RE | Disposition: A | Discharge status: Home / Self Care | Payer: Self-pay | Source: Home / Self Care | Attending: Surgery

## 2024-02-06 ENCOUNTER — Ambulatory Visit: Payer: Self-pay | Admitting: Urgent Care

## 2024-02-06 DIAGNOSIS — E119 Type 2 diabetes mellitus without complications: Secondary | ICD-10-CM | POA: Diagnosis not present

## 2024-02-06 DIAGNOSIS — Z87891 Personal history of nicotine dependence: Secondary | ICD-10-CM | POA: Diagnosis not present

## 2024-02-06 DIAGNOSIS — M7581 Other shoulder lesions, right shoulder: Secondary | ICD-10-CM | POA: Diagnosis present

## 2024-02-06 DIAGNOSIS — K219 Gastro-esophageal reflux disease without esophagitis: Secondary | ICD-10-CM | POA: Insufficient documentation

## 2024-02-06 DIAGNOSIS — I10 Essential (primary) hypertension: Secondary | ICD-10-CM | POA: Diagnosis not present

## 2024-02-06 DIAGNOSIS — M19011 Primary osteoarthritis, right shoulder: Secondary | ICD-10-CM | POA: Diagnosis present

## 2024-02-06 DIAGNOSIS — E1169 Type 2 diabetes mellitus with other specified complication: Secondary | ICD-10-CM

## 2024-02-06 LAB — GLUCOSE, CAPILLARY
Glucose-Capillary: 91 mg/dL (ref 70–99)
Glucose-Capillary: 153 mg/dL — ABNORMAL HIGH (ref 70–99)

## 2024-02-06 MED ORDER — GLYCOPYRROLATE 0.2 MG/ML IJ SOLN
INTRAMUSCULAR | Status: DC | PRN
  Administered 2024-02-06: .2 mg via INTRAVENOUS

## 2024-02-06 MED ORDER — VITAMIN D (ERGOCALCIFEROL) 1.25 MG (50000 UNIT) PO CAPS: 50000.0000 [IU] | ORAL_CAPSULE | ORAL | Status: AC

## 2024-02-06 MED ORDER — CEFAZOLIN SODIUM-DEXTROSE 2-4 GM/100ML-% IV SOLN
2.0000 g | Freq: Four times a day (QID) | INTRAVENOUS | Status: DC
  Administered 2024-02-06: 2 g via INTRAVENOUS

## 2024-02-06 MED ORDER — LIDOCAINE HCL (CARDIAC) PF 100 MG/5ML IV SOSY
PREFILLED_SYRINGE | INTRAVENOUS | Status: DC | PRN
  Administered 2024-02-06: 100 mg via INTRAVENOUS

## 2024-02-06 MED ORDER — SODIUM CHLORIDE 0.9 % IR SOLN
Status: DC | PRN
  Administered 2024-02-06: 3000 mL

## 2024-02-06 MED ORDER — BUPIVACAINE LIPOSOME 1.3 % IJ SUSP
INTRAMUSCULAR | Status: AC
  Filled 2024-02-06: qty 10

## 2024-02-06 MED ORDER — PROPOFOL 10 MG/ML IV BOLUS
INTRAVENOUS | Status: DC | PRN
  Administered 2024-02-06: 150 ug/kg/min via INTRAVENOUS
  Administered 2024-02-06: 150 mg via INTRAVENOUS
  Administered 2024-02-06: 30 mg via INTRAVENOUS

## 2024-02-06 MED ORDER — ROCURONIUM BROMIDE 10 MG/ML (PF) SYRINGE
PREFILLED_SYRINGE | INTRAVENOUS | Status: AC
  Filled 2024-02-06: qty 10

## 2024-02-06 MED ORDER — MIDAZOLAM HCL (PF) 2 MG/2ML IJ SOLN
2.0000 mg | Freq: Once | INTRAMUSCULAR | Status: AC
  Administered 2024-02-06: 2 mg via INTRAVENOUS

## 2024-02-06 MED ORDER — BUPIVACAINE-EPINEPHRINE (PF) 0.5% -1:200000 IJ SOLN
INTRAMUSCULAR | Status: DC | PRN
  Administered 2024-02-06: 40 mL via INTRAMUSCULAR

## 2024-02-06 MED ORDER — BUPIVACAINE HCL (PF) 0.5 % IJ SOLN
INTRAMUSCULAR | Status: DC | PRN
  Administered 2024-02-06: 10 mL

## 2024-02-06 MED ORDER — ONDANSETRON HCL 4 MG/2ML IJ SOLN
INTRAMUSCULAR | Status: AC
  Filled 2024-02-06: qty 2

## 2024-02-06 MED ORDER — ONDANSETRON HCL 4 MG/2ML IJ SOLN
INTRAMUSCULAR | Status: DC | PRN
  Administered 2024-02-06: 4 mg via INTRAVENOUS

## 2024-02-06 MED ORDER — OXYCODONE HCL 5 MG PO TABS: 5.0000 mg | ORAL_TABLET | ORAL | 0 refills | Status: AC | PRN

## 2024-02-06 MED ORDER — DEXAMETHASONE SOD PHOSPHATE PF 10 MG/ML IJ SOLN
INTRAMUSCULAR | Status: DC | PRN
  Administered 2024-02-06: 10 mg via INTRAVENOUS

## 2024-02-06 MED ORDER — PHENYLEPHRINE 80 MCG/ML (10ML) SYRINGE FOR IV PUSH (FOR BLOOD PRESSURE SUPPORT)
PREFILLED_SYRINGE | INTRAVENOUS | Status: DC | PRN
  Administered 2024-02-06: 80 ug via INTRAVENOUS
  Administered 2024-02-06 (×2): 160 ug via INTRAVENOUS

## 2024-02-06 MED ORDER — OXYCODONE HCL 5 MG PO TABS
5.0000 mg | ORAL_TABLET | Freq: Once | ORAL | Status: AC | PRN
  Administered 2024-02-06: 5 mg via ORAL

## 2024-02-06 MED ORDER — LACTATED RINGERS IV SOLN: INTRAVENOUS | Status: DC | PRN

## 2024-02-06 MED ORDER — PHENYLEPHRINE HCL-NACL 20-0.9 MG/250ML-% IV SOLN
INTRAVENOUS | Status: DC | PRN
  Administered 2024-02-06: 30 ug/min via INTRAVENOUS

## 2024-02-06 MED ORDER — ROCURONIUM BROMIDE 100 MG/10ML IV SOLN
INTRAVENOUS | Status: DC | PRN
  Administered 2024-02-06 (×2): 50 mg via INTRAVENOUS

## 2024-02-06 MED ORDER — TIRZEPATIDE 5 MG/0.5ML ~~LOC~~ SOAJ: 5.0000 mg | SUBCUTANEOUS | Status: AC

## 2024-02-06 MED ORDER — DEXAMETHASONE SOD PHOSPHATE PF 10 MG/ML IJ SOLN
INTRAMUSCULAR | Status: AC
  Filled 2024-02-06: qty 1

## 2024-02-06 MED ORDER — CEFAZOLIN SODIUM-DEXTROSE 2-4 GM/100ML-% IV SOLN
INTRAVENOUS | Status: AC
  Filled 2024-02-06: qty 100

## 2024-02-06 MED ORDER — PHENYLEPHRINE 80 MCG/ML (10ML) SYRINGE FOR IV PUSH (FOR BLOOD PRESSURE SUPPORT)
PREFILLED_SYRINGE | INTRAVENOUS | Status: AC
  Filled 2024-02-06: qty 10

## 2024-02-06 MED ORDER — FENTANYL CITRATE (PF) 100 MCG/2ML IJ SOLN
INTRAMUSCULAR | Status: AC
  Filled 2024-02-06: qty 2

## 2024-02-06 MED ORDER — BUPIVACAINE LIPOSOME 1.3 % IJ SUSP
INTRAMUSCULAR | Status: DC | PRN
  Administered 2024-02-06: 10 mL

## 2024-02-06 MED ORDER — LIDOCAINE HCL (PF) 1 % IJ SOLN
INTRAMUSCULAR | Status: DC | PRN
  Administered 2024-02-06: 3 mL

## 2024-02-06 MED ORDER — KETOROLAC TROMETHAMINE 15 MG/ML IJ SOLN
15.0000 mg | Freq: Once | INTRAMUSCULAR | Status: AC
  Administered 2024-02-06: 15 mg via INTRAVENOUS

## 2024-02-06 MED ORDER — KETOROLAC TROMETHAMINE 15 MG/ML IJ SOLN
INTRAMUSCULAR | Status: AC
  Filled 2024-02-06: qty 1

## 2024-02-06 MED ORDER — SODIUM CHLORIDE (PF) 0.9 % IJ SOLN
INTRAMUSCULAR | Status: AC
  Filled 2024-02-06: qty 10

## 2024-02-06 MED ORDER — LIDOCAINE HCL (PF) 2 % IJ SOLN
INTRAMUSCULAR | Status: AC
  Filled 2024-02-06: qty 5

## 2024-02-06 MED ORDER — PROPOFOL 1000 MG/100ML IV EMUL
INTRAVENOUS | Status: AC
  Filled 2024-02-06: qty 100

## 2024-02-06 MED ORDER — FENTANYL CITRATE (PF) 100 MCG/2ML IJ SOLN: 25.0000 ug | INTRAMUSCULAR | Status: DC | PRN

## 2024-02-06 MED ORDER — BUPIVACAINE-EPINEPHRINE (PF) 0.5% -1:200000 IJ SOLN
INTRAMUSCULAR | Status: AC
  Filled 2024-02-06: qty 30

## 2024-02-06 MED ORDER — STERILE WATER FOR IRRIGATION IR SOLN
Status: DC | PRN
  Administered 2024-02-06: 1000 mL

## 2024-02-06 MED ORDER — FENTANYL CITRATE (PF) 100 MCG/2ML IJ SOLN
INTRAMUSCULAR | Status: DC | PRN
  Administered 2024-02-06 (×2): 50 ug via INTRAVENOUS

## 2024-02-06 MED ORDER — TRANEXAMIC ACID-NACL 1000-0.7 MG/100ML-% IV SOLN
INTRAVENOUS | Status: AC
  Filled 2024-02-06: qty 100

## 2024-02-06 MED ORDER — LIDOCAINE HCL (PF) 1 % IJ SOLN
INTRAMUSCULAR | Status: AC
  Filled 2024-02-06: qty 5

## 2024-02-06 MED ORDER — SUGAMMADEX SODIUM 200 MG/2ML IV SOLN
INTRAVENOUS | Status: DC | PRN
  Administered 2024-02-06: 200 mg via INTRAVENOUS

## 2024-02-06 MED ORDER — BUPIVACAINE HCL (PF) 0.5 % IJ SOLN
INTRAMUSCULAR | Status: AC
  Filled 2024-02-06: qty 10

## 2024-02-06 MED ORDER — OXYCODONE HCL 5 MG PO TABS
ORAL_TABLET | ORAL | Status: AC
  Filled 2024-02-06: qty 1

## 2024-02-06 MED ORDER — OXYCODONE HCL 5 MG/5ML PO SOLN: 5.0000 mg | Freq: Once | ORAL | Status: AC | PRN

## 2024-02-06 MED ORDER — MIDAZOLAM HCL 2 MG/2ML IJ SOLN
INTRAMUSCULAR | Status: AC
  Filled 2024-02-06: qty 2

## 2024-02-06 MED ORDER — CHLORHEXIDINE GLUCONATE 0.12 % MT SOLN
OROMUCOSAL | Status: AC
  Filled 2024-02-06: qty 15

## 2024-02-06 MED ORDER — ACETAMINOPHEN 10 MG/ML IV SOLN
INTRAVENOUS | Status: AC
  Filled 2024-02-06: qty 100

## 2024-02-06 MED ORDER — KETOROLAC TROMETHAMINE 30 MG/ML IJ SOLN
INTRAMUSCULAR | Status: AC
  Filled 2024-02-06: qty 1

## 2024-02-06 NOTE — H&P (Signed)
 History of Present Illness:  Lee Lucas is a 64 y.o. male who has severe right shoulder pain. Patient has failed conservative treatment including NSAID's, injections, PT, HEP, and activity modification. The patient states that the right shoulder pain has progressed to the point that it is significantly interfering with his activities of daily living. These symptoms are aggravated with normal daily activities, with sleeping, carrying heavy objects, at higher levels of activity, with overhead activity, reaching behind the back, and activity in general. He has tried acetaminophen , non-steroidal anti-inflammatories (Advil and Voltaren gel), and narcotics with limited benefit. He has tried rest and activity modification with limited benefit. He has tried the one injection described above, also with no significant substantial relief of his symptoms. In addition, he has been applying heat or heat alternating with ice to the shoulder, again without any substantial relief of his symptoms. He continues to perform full duties at work, but has been trying to modify his lifting in order to minimize aggravating his symptoms. The patient denies any neck pain no dizzy noted numbness or paresthesias down his arm to his hand. He is right-hand dominant, but is status post a reverse left total shoulder arthroplasty 3 years ago from which he has done quite well. He has requested operative intervention for relief of symptoms.   Patient denies recent changes to medical history. He reports no relevant cardiac or pulmonary history. No previous joint surgery.  Social history:  Denies smoking, alcohol, and ilicit drug use. He is a past smoker of 27 years.  Medications: aspirin  81 MG EC tablet Take 81 mg by mouth once daily.  atorvastatin  (LIPITOR) 40 MG tablet TAKE 1 TABLET BY MOUTH AT BEDTIME 60 tablet 5  ergocalciferol , vitamin D2, 1,250 mcg (50,000 unit) capsule Take 1 capsule by mouth once a week 12 capsule 0  ibuprofen  (MOTRIN) 200 MG tablet Take 200 mg by mouth every 6 (six) hours as needed for Pain  lisinopriL  (ZESTRIL ) 10 MG tablet Take 1 tablet by mouth once daily 90 tablet 3  metFORMIN  (GLUCOPHAGE ) 1000 MG tablet TAKE 1 TABLET BY MOUTH TWICE A DAY WITH MEALS 180 tablet 1  pantoprazole  (PROTONIX ) 20 MG DR tablet Take 1 tablet (20 mg total) by mouth once daily 90 tablet 3  peg-electrolyte (NULYTELY) solution Use as directed. 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 Edie)  REPAIR ROTATOR CUFF TEAR ACUTE OPEN Left 2018  Reverse left total shoulder arthroplasty with biceps tenodesis. Left 08/27/2020 (Dr. Edie)  COLON@[facility]  09/06/2023 (Normal/Repeat64yrs/TKT)  EGD@[facility]  09/06/2023 (BarrettsEsophagus/Repeat77yrs/TKT)  Right testicle removed  TONSILLECTOMY   Physical Exam:  BP: 122/78  Weight: 93.9 kg (207 lb)  Height: 180.3 cm (5' 11)  PainSc: 10-Worst pain ever  PainLoc: Shoulder   General/Constitutional: No apparent distress: well-nourished and well developed. Eyes: Pupils equal, round with synchronous movement. Lymphatic: No palpable adenopathy. Respiratory: Non-labored breathing. Lungs clear to auscultation. No abnormal breath sounds. Cardiac: RRR. S1 and S2 noted. No murmurs or gallops. Vascular:  No edema, swelling or tenderness, except as noted in detailed exam. Integumentary: No  impressive skin lesions present, except as noted in detailed exam. Neuro/Psych: Normal mood and affect, oriented to person, place and time. Musculoskeletal:   Right shoulder exam: SKIN: normal SWELLING: none WARMTH: none LYMPH NODES: no adenopathy palpable CREPITUS: Mild glenohumeral crepitance TENDERNESS: Mild-moderate tenderness over the anterolateral shoulder ROM (active):  Forward flexion: 80 degrees Abduction: 65 degrees Internal rotation: Right buttock ROM (passive):  Forward flexion: 110 degrees Abduction: 95  ER/IR at 90 abd: 60 degrees / 40 degrees  He experiences moderate-severe pain with all motions.  STRENGTH: Forward flexion: 4/5 Abduction: 4/5 External rotation: 4/5 Internal rotation: 4-4+/5 Pain with RC testing: Moderate pain with resisted strength testing in all planes  STABILITY: Normal  SPECIAL TESTS: Vonzell' test: positive, moderate Speed's test: positive Capsulitis - pain w/ passive ER: no Crossed arm test: Mildly positive Crank: Not evaluated Anterior apprehension: Negative Posterior apprehension: Not evaluated  He is neurovascularly intact to the right upper extremity.  Imaging: Recent true AP, Y-scapular, and AP views of the right shoulder These films demonstrate significant degenerative changes with complete loss of the glenohumeral joint space, as well as a large inferior humeral osteophyte. The subacromial space is moderately decreased. There is no subacromial or infra-clavicular spurring. He demonstrates a Type II acromion.  Assesment and Plan   Assessment:  1. Primary osteoarthritis of right shoulder.  2. Rotator cuff tendinitis, right.   Plan:  The treatment options were discussed with the patient and his fiance. In addition, patient educational materials were provided regarding the diagnosis and treatment options. The patient is quite frustrated by his symptoms and functional limitations, and is ready to consider more aggressive  treatment options. Therefore, I have recommended a surgical procedure, specifically a reverse right total shoulder arthroplasty, especially given how well he has done following his left reverse total shoulder arthroplasty. The procedure was discussed with the patient, as were the potential risks (including bleeding, infection, nerve and/or blood vessel injury, persistent or recurrent pain, failure of the repair, progression of arthritis, need for further surgery, blood clots, strokes, heart attacks and/or arhythmias, pneumonia, etc.) and benefits. The patient states his understanding and wishes to proceed.    H&P reviewed and patient re-examined. No changes.

## 2024-02-06 NOTE — Op Note (Signed)
 02/06/2024  1:36 PM  Patient:   Lee Lucas  Pre-Op Diagnosis:   Advanced rotator cuff arthropathy, right shoulder.  Post-Op Diagnosis:   Same with biceps tendinopathy.  Procedure:   Reverse right total shoulder arthroplasty with biceps tenodesis.  Surgeon:   DOROTHA Reyes Maltos, MD  Assistant:   Gustavo Level, PA-C  Anesthesia:   General endotracheal with an interscalene block using Exparel  placed preoperatively by the anesthesiologist.  Findings:   As above.  Complications:   None  EBL:   75 cc  Fluids:   1100 cc crystalloid  UOP:   None  TT:   None  Drains:   None  Closure:   Staples  Implants:   All press-fit Arthrex system with a #13 standard [employer] Revers humeral stem, a 39 mm SutureCup with a +6 mm augment, a +3 mm insert, and a 24 mm base plate with a 39 mm +4 mm lateralized glenosphere.  Brief Clinical Note:   The patient is a 64 year old male with a long history of progressively worsening right shoulder pain and weakness. His symptoms have progressed despite medications, activity modification, etc. 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 The patient presents at this time for a reverse right total shoulder arthroplasty.  Procedure:   The patient underwent placement of an interscalene block using Exparel  by the anesthesiologist in the preoperative holding area before being brought into the operating room and lain in the supine position. The patient then underwent general endotracheal intubation and anesthesia before the patient was repositioned in the beach chair position using the beach chair positioner. The right shoulder and upper extremity were prepped with ChloraPrep solution before being draped sterilely. Preoperative antibiotics were administered. A timeout was performed to verify the appropriate surgical site.    A standard  anterior approach to the shoulder was made through an approximately 4-5 inch incision. The incision was carried down through the subcutaneous tissues to expose the deltopectoral fascia. The interval between the deltoid and pectoralis muscles was identified and this plane developed, retracting the cephalic vein laterally with the deltoid muscle. The conjoined tendon was identified. Its lateral margin was dissected and the Kolbel self-retraining retractor inserted. The three sisters were identified and cauterized. Bursal tissues were removed to improve visualization.   The biceps tendon was identified near the inferior aspect of the bicipital groove. A soft tissue tenodesis was performed by attaching the biceps tendon to the adjacent pectoralis major tendon using two #0 Ethibond interrupted sutures. The biceps tendon was then transected just proximal to the tenodesis site. The subscapularis tendon was released from its attachment to the lesser tuberosity 1 cm proximal to its insertion and several tagging sutures placed. The inferior capsule was released with care after identifying and protecting the axillary nerve. The proximal humeral cut was made at approximately 30 of retroversion using the extra-medullary guide.   Attention was redirected to the glenoid. The labrum was debrided circumferentially before the center of the glenoid was marked with electrocautery. The guidewire was drilled into the glenoid neck using the appropriate guide based on preoperative planning. After verifying its position, it was overreamed with the 10 degree augmented baseplate reamer to create a flat surface with care taken to be sure the augmented position was oriented properly. The central 30 mm coring reamer was then inserted to complete the glenoid preparation. The permanent mini-baseplate construct with a 30 mm post  attachment was inserted with care taken to maintain the appropriate orientation of the augmented component. The  baseplate was then further secured using four peripheral screws. One non-locking screw was placed inferiorly before 3 locking screws were placed superiorly, anteriorly, and posteriorly. The permanent 39 mm +4 mm lateralized glenosphere was then impacted into place and its Morse taper locking mechanism verified using manual distraction. Finally, the glenosphere locking screw was inserted and tightened securely.  Attention was directed to the humeral side. The humeral canal was reamed with first the 5 mm and then the 6 mm reamer. The canal was broached beginning with a #5 broach and progressing to a #13 broach. This was left in place and the metaphyseal inset reamer was used to create the metaphyseal socket.  A trial reduction performed using first the standard humeral platform and then the +6 mm trial humeral platform.  With the +3 mm insert, the arm demonstrated excellent range of motion as the hand could be brought across the chest to the opposite shoulder and brought to the top of the patient's head and to the patient's ear. The shoulder appeared stable throughout this range of motion. The joint was dislocated and the trial components removed. The permanent #13 standard [employer] Revers stem with the standard humeral platform was impacted into place with care taken to maintain the appropriate version. The +6 mm humeral platform augment device was inserted and secured appropriately before the +3 mm insert was snapped into place. The shoulder was relocated using two finger pressure and again placed through a range of motion with the findings as described above.  The wound was copiously irrigated with sterile saline solution using the jet lavage system before a solution of 10 cc of Exparel  and 30 cc of 0.5% Sensorcaine  with epinephrine  was injected into the pericapsular and peri-incisional tissues to help with postoperative analgesia. The subscapularis tendon was reapproximated using #2 FiberWire interrupted  sutures. The deltopectoral interval was closed using #0 Vicryl interrupted sutures before the subcutaneous tissues were closed using 2-0 Vicryl interrupted sutures. The skin was closed using staples. A sterile occlusive dressing was applied to the wound before the arm was placed into a shoulder immobilizer with an abduction pillow. A Polar Care system also was applied to the shoulder. The patient was then transferred back to a hospital bed before being awakened, extubated, and returned to the recovery room in satisfactory condition after tolerating the procedure well.

## 2024-02-06 NOTE — Anesthesia Procedure Notes (Signed)
 Procedure Name: Intubation Date/Time: 02/06/2024 11:26 AM  Performed by: Bonnetta Jimmey SAUNDERS, CRNAPre-anesthesia Checklist: Patient identified, Emergency Drugs available, Suction available and Patient being monitored Patient Re-evaluated:Patient Re-evaluated prior to induction Oxygen Delivery Method: Circle system utilized Preoxygenation: Pre-oxygenation with 100% oxygen Induction Type: IV induction Ventilation: Mask ventilation without difficulty Laryngoscope Size: McGrath and 4 Grade View: Grade I Tube type: Oral Tube size: 7.0 mm Number of attempts: 1 Airway Equipment and Method: Stylet and Oral airway Placement Confirmation: ETT inserted through vocal cords under direct vision, positive ETCO2 and breath sounds checked- equal and bilateral Secured at: 22 cm Tube secured with: Tape Dental Injury: Teeth and Oropharynx as per pre-operative assessment

## 2024-02-06 NOTE — Anesthesia Procedure Notes (Signed)
 Anesthesia Regional Block: Interscalene brachial plexus block   Pre-Anesthetic Checklist: , timeout performed,  Correct Patient, Correct Site, Correct Laterality,  Correct Procedure, Correct Position, site marked,  Risks and benefits discussed,  Surgical consent,  Pre-op evaluation,  At surgeon's request and post-op pain management  Laterality: Right  Prep: alcohol swabs       Needles:  Injection technique: Single-shot  Needle Type: Echogenic Needle     Needle Length: 4cm  Needle Gauge: 25     Additional Needles:   Procedures:,,,, ultrasound used (permanent image in chart),,    Narrative:  Start time: 02/06/2024 10:37 AM End time: 02/06/2024 10:39 AM Injection made incrementally with aspirations every 5 mL.  Performed by: Personally  Anesthesiologist: Chesley Lendia CROME, MD  Additional Notes: Patient's chart reviewed and they were deemed appropriate candidate for procedure, at surgeon's request. Patient educated about risks, benefits, and alternatives of the block including but not limited to: temporary or permanent nerve damage, bleeding, infection, damage to surround tissues, pneumothorax, hemidiaphragmatic paralysis, unilateral Horner's syndrome, block failure, local anesthetic toxicity. Patient expressed understanding. A formal time-out was conducted consistent with institution rules.  Monitors were applied, and minimal sedation used (see nursing record). The site was prepped with skin prep and allowed to dry, and sterile gloves were used. A high frequency linear ultrasound probe with probe cover was utilized throughout. C5-7 nerve roots located and appeared anatomically normal, local anesthetic injected around them, and echogenic block needle trajectory was monitored throughout. Aspiration performed every 5ml. Lung and blood vessels were avoided. All injections were performed without resistance and free of blood and paresthesias. The patient tolerated the procedure  well.  Injectate: 10ml exparel  + 10cc 0.5% bupi

## 2024-02-06 NOTE — Transfer of Care (Signed)
 Immediate Anesthesia Transfer of Care Note  Patient: Lee Lucas  Procedure(s) Performed: ARTHROPLASTY, SHOULDER, TOTAL, REVERSE (Right: Shoulder)  Patient Location: PACU  Anesthesia Type:General  Level of Consciousness: drowsy  Airway & Oxygen Therapy: Patient Spontanous Breathing  Post-op Assessment: Report given to RN and Post -op Vital signs reviewed and stable  Post vital signs: Reviewed and stable  Last Vitals:  Vitals Value Taken Time  BP 108/52 02/06/24 13:53  Temp    Pulse 65 02/06/24 13:59  Resp 14 02/06/24 13:59  SpO2 94 % 02/06/24 13:59  Vitals shown include unfiled device data.  Last Pain:  Vitals:   02/06/24 0834  TempSrc: Temporal  PainSc: 0-No pain         Complications: There were no known notable events for this encounter.

## 2024-02-06 NOTE — Evaluation (Signed)
 Occupational Therapy Evaluation Patient Details Name: Lee Lucas MRN: [id number] DOB: [birth year] Today's Date: 02/06/2024   History of Present Illness   64 year old male s/p reverse R TSA and biceps tenodesis. PMH of L TSA, cancer, GERD, HTN.     Clinical Impressions Pt was seen for an OT evaluation this date. Pt lives in a one level home with 2-3 STE with bil HR with his fiance. Prior to surgery, pt was active and independent, still working full time. Pt has orders for RUE to be immobilized and will be NWBing per MD. He presents with impaired strength/ROM, pain, and sensation to RUE with block not completely resolved yet. These impairments result in a decreased ability to perform self care tasks requiring Max assist for UB dressing and for application of polar care, compression stockings, and sling/immobilizer. Pt and his fiance instructed in polar care mgt, compression stockings mgt, sling/immobilizer mgt, ROM exercises for RUE (with instructions for no shoulder exercises until full sensation has returned), RUE precautions, adaptive strategies for ADL management, positioning and considerations for sleep, and home/routines modifications to maximize falls prevention, safety, and independence. Handout provided. OT adjusted sling/immobilizer and polar care to improve comfort, optimize positioning, and to maximize skin integrity/safety. He performed STS and ambulation with supervision, stair training to ascend/descend 3 steps with CGA. Pt and fiance verbalized understanding of all education/training provided. Recommend HHOT services to continue therapy to maximize return to PLOF, address home/routines modifications and safety, minimize falls risk, and minimize caregiver burden.        If plan is discharge home, recommend the following:   A lot of help with bathing/dressing/bathroom;Help with stairs or ramp for entrance;Assistance with cooking/housework     Functional Status Assessment    Patient has not had a recent decline in their functional status     Equipment Recommendations         Recommendations for Other Services         Precautions/Restrictions   Precautions Precautions: Shoulder Shoulder Interventions: Shoulder sling/immobilizer;At all times;Shoulder abduction pillow;Off for dressing/bathing/exercises Precaution Booklet Issued: Yes (comment) Recall of Precautions/Restrictions: Intact Required Braces or Orthoses: Sling Restrictions Weight Bearing Restrictions Per Provider Order: Yes RUE Weight Bearing Per Provider Order: Non weight bearing     Mobility Bed Mobility               General bed mobility comments: NT in recliner pre/post session    Transfers Overall transfer level: Needs assistance   Transfers: Sit to/from Stand Sit to Stand: Supervision           General transfer comment: to stand and ambulate ~65 ft; CGA for stair training using HR on L side going up and down      Balance Overall balance assessment: Needs assistance   Sitting balance-Leahy Scale: Good       Standing balance-Leahy Scale: Fair                             ADL either performed or assessed with clinical judgement   ADL Overall ADL's : Needs assistance/impaired                 Upper Body Dressing : Maximal assistance;Sitting                   Functional mobility during ADLs: Supervision/safety;Contact guard assist       Vision         Perception  Praxis         Pertinent Vitals/Pain       Extremity/Trunk Assessment Upper Extremity Assessment Upper Extremity Assessment: Right hand dominant;RUE deficits/detail RUE Deficits / Details: s/p reverse TSA with biceps tenodesis   Lower Extremity Assessment Lower Extremity Assessment: Overall WFL for tasks assessed       Communication Communication Communication: No apparent difficulties   Cognition Arousal: Alert Behavior During Therapy:  WFL for tasks assessed/performed Cognition: No apparent impairments                               Following commands: Intact       Cueing  General Comments   Cueing Techniques: Verbal cues      Exercises Other Exercises Other Exercises: Edu on role of OT in acute setting, polar care, compression stockings, sling/immobilizer and WB precautions as well as limitations and techniques for ADL management following shoulder precautions.   Shoulder Instructions      Home Living Family/patient expects to be discharged to:: Private residence Living Arrangements: Spouse/significant other Available Help at Discharge: Family;Available PRN/intermittently Type of Home: House Home Access: Stairs to enter [employer] of Steps: 2-3 Entrance Stairs-Rails: Right;Left Home Layout: One level               Home Equipment: None          Prior Functioning/Environment Prior Level of Function : Independent/Modified Independent;Working/employed;Driving                    OT Problem List: Decreased range of motion;Decreased strength;Pain   OT Treatment/Interventions:        OT Goals(Current goals can be found in the care plan section)       OT Frequency:       Co-evaluation              AM-PAC OT 6 Clicks Daily Activity     Outcome Measure Help from another person eating meals?: None Help from another person taking care of personal grooming?: A Little Help from another person toileting, which includes using toliet, bedpan, or urinal?: A Little Help from another person bathing (including washing, rinsing, drying)?: A Lot Help from another person to put on and taking off regular upper body clothing?: A Lot Help from another person to put on and taking off regular lower body clothing?: A Lot 6 Click Score: 16   End of Session Equipment Utilized During Treatment:  (shoulder immobilizer) Nurse Communication: Mobility status  Activity  Tolerance: Patient tolerated treatment well Patient left: in chair;with family/visitor present  OT Visit Diagnosis: Muscle weakness (generalized) (M62.81)                Time: 8478-8441 OT Time Calculation (min): 37 min Charges:  OT General Charges $OT Visit: 1 Visit OT Evaluation $OT Eval Moderate Complexity: 1 Mod OT Treatments $Self Care/Home Management : 8-22 mins  Aldrich Lloyd Chrismon, OTR/L 02/06/24, 4:55 PM  Anisa Leanos E Chrismon 02/06/2024, 4:51 PM

## 2024-02-06 NOTE — Anesthesia Preprocedure Evaluation (Signed)
 "                                  Anesthesia Evaluation  Patient identified by MRN, date of birth, ID band Patient awake    Reviewed: Allergy & Precautions, NPO status , Patient's Chart, lab work & pertinent test results  History of Anesthesia Complications Negative for: history of anesthetic complications  Airway Mallampati: III  TM Distance: >3 FB Neck ROM: full    Dental no notable dental hx.    Pulmonary neg pulmonary ROS, former smoker   Pulmonary exam normal        Cardiovascular hypertension, On Medications negative cardio ROS Normal cardiovascular exam     Neuro/Psych negative neurological ROS  negative psych ROS   GI/Hepatic Neg liver ROS,GERD  Medicated,,  Endo/Other  negative endocrine ROSdiabetes, Well Controlled, Type 2    Renal/GU   negative genitourinary   Musculoskeletal   Abdominal   Peds  Hematology negative hematology ROS (+)   Anesthesia Other Findings Past Medical History: No date: Arthritis     Comment:  left shoulder, left knee No date: Barrett's esophagus 01/10/1993: 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 No date: Diabetes mellitus treated with injections of non-insulin   medication (HCC) No date: Diabetes mellitus without complication (HCC) No date: GERD (gastroesophageal reflux disease) No date: HBP (high blood pressure) No date: Morbid obesity due to excess calories (HCC) No date: Primary hypertension No date: Pure hypercholesterolemia No date: Reflux  Past Surgical History: 08/27/2020: BICEPT TENODESIS     Comment:  Procedure: BICEPS TENODESIS;  Surgeon: Edie Norleen PARAS,               MD;  Location: [facility];  Service: Orthopedics;; 09/06/2023: COLONOSCOPY; N/A     Comment:  Procedure: COLONOSCOPY;  Surgeon: Toledo, Ladell POUR, MD;              Location: [facility];  Service: Gastroenterology;                Laterality: N/A;  DM On Mounjaro  08/05/13: COLONOSCOPY WITH ESOPHAGOGASTRODUODENOSCOPY  (EGD) 09/06/2023: ESOPHAGOGASTRODUODENOSCOPY; N/A     Comment:  Procedure: EGD (ESOPHAGOGASTRODUODENOSCOPY);  Surgeon:               Toledo, Ladell POUR, MD;  Location: [facility];                Service: Gastroenterology;  Laterality: N/A; 7.22.14: FOOT SURGERY; Left 08/27/2020: REVERSE SHOULDER ARTHROPLASTY; Left     Comment:  Procedure: REVERSE SHOULDER ARTHROPLASTY;  Surgeon:               Edie Norleen PARAS, MD;  Location: [facility];  Service:               Orthopedics;  Laterality: Left; 01/28/2015: SHOULDER ARTHROSCOPY; Left     Comment:  Procedure: ARTHROSCOPY SHOULDER WITH DEBRIDEMENT               DECOMPRESSION AND MANIPULATION UNDER ANESTHESIA;                Surgeon: Norleen PARAS Edie, MD;  Location: [facility]               [facility];  Service: Orthopedics;  Laterality: Left;  Diabetic              - 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; Right  Comment:  AND LYMPH NODES     Reproductive/Obstetrics negative OB ROS                              Anesthesia Physical Anesthesia Plan  ASA: 2  Anesthesia Plan: General ETT   Post-op Pain Management: Toradol  IV (intra-op)*, Ofirmev  IV (intra-op)* and Regional block*   Induction: Intravenous  PONV Risk Score and Plan: 2 and Ondansetron , Dexamethasone , Midazolam  and Treatment may vary due to age or medical condition  Airway Management Planned: Oral ETT  Additional Equipment:   Intra-op Plan:   Post-operative Plan: Extubation in OR  Informed Consent: I have reviewed the patients History and Physical, chart, labs and discussed the procedure including the risks, benefits and alternatives for the proposed anesthesia with the patient or authorized representative who has indicated his/her understanding and acceptance.     Dental Advisory Given  Plan Discussed with: Anesthesiologist, CRNA and Surgeon  Anesthesia Plan Comments: (Patient consented for risks of anesthesia including but not limited to:  - adverse  reactions to medications - damage to eyes, teeth, lips or other oral mucosa - nerve damage due to positioning  - sore throat or hoarseness - Damage to heart, brain, nerves, lungs, other parts of body or loss of life  Patient voiced understanding and assent.)        Anesthesia Quick Evaluation  "

## 2024-02-06 NOTE — Discharge Instructions (Addendum)
 Orthopedic discharge instructions: May shower with intact OpSite dressing once nerve block has worn off (Saturday).  Cover staples with Band-Aids after drying off. Apply ice frequently to shoulder or use Polar Care device. Take ibuprofen 800 mg TID with meals for 3-5 days, then as necessary. Take oxycodone  as prescribed when needed.  May supplement with ES Tylenol  if necessary. Keep shoulder immobilizer on at all times except may remove for bathing purposes. Follow-up in 10-14 days or as scheduled.                       Information for Discharge Teaching: EXPAREL  (bupivacaine  liposome injectable suspension)   Pain relief is important to your recovery. The goal is to control your pain so you can move easier and return to your normal activities as soon as possible after your procedure. Your physician may use several types of medicines to manage pain, swelling, and more.  Your surgeon or anesthesiologist gave you EXPAREL (bupivacaine ) to help control your pain after surgery.  EXPAREL  is a local anesthetic designed to release slowly over an extended period of time to provide pain relief by numbing the tissue around the surgical site. EXPAREL  is designed to release pain medication over time and can control pain for up to 72 hours. Depending on how you respond to EXPAREL , you may require less pain medication during your recovery. EXPAREL  can help reduce or eliminate the need for opioids during the first few days after surgery when pain relief is needed the most. EXPAREL  is not an opioid and is not addictive. It does not cause sleepiness or sedation.   Important! A teal colored band has been placed on your arm with the date, time and amount of EXPAREL  you have received. Please leave this armband in place for the full 96 hours following administration, and then you may remove the band. If you return to the hospital for any reason within 96 hours following the administration of EXPAREL , the armband  provides important information that your health care providers to know, and alerts them that you have received this anesthetic.    Possible side effects of EXPAREL : Temporary loss of sensation or ability to move in the area where medication was injected. Nausea, vomiting, constipation Rarely, numbness and tingling in your mouth or lips, lightheadedness, or anxiety may occur. Call your doctor right away if you think you may be experiencing any of these sensations, or if you have other questions regarding possible side effects.  Follow all other discharge instructions given to you by your surgeon or nurse. Eat a healthy diet and drink plenty of water  or other fluids.

## 2024-02-06 NOTE — Anesthesia Postprocedure Evaluation (Signed)
"   Anesthesia Post Note  Patient: Lee Lucas  Procedure(s) Performed: ARTHROPLASTY, SHOULDER, TOTAL, REVERSE (Right: Shoulder)  Patient location during evaluation: PACU Anesthesia Type: General Level of consciousness: awake and alert Pain management: pain level controlled Vital Signs Assessment: post-procedure vital signs reviewed and stable Respiratory status: spontaneous breathing, nonlabored ventilation and respiratory function stable Cardiovascular status: blood pressure returned to baseline and stable Postop Assessment: no apparent nausea or vomiting Anesthetic complications: no   There were no known notable events for this encounter.   Last Vitals:  Vitals:   02/06/24 1430 02/06/24 1454  BP: 101/71 124/73  Pulse: 75 71  Resp: 17 15  Temp: (!) 36.4 C (!) 36.4 C  SpO2: 94% 94%    Last Pain:  Vitals:   02/06/24 1454  TempSrc: Temporal  PainSc: 0-No pain                 Camellia Merilee Louder      "

## 2024-02-07 ENCOUNTER — Encounter: Payer: Self-pay | Admitting: Surgery

## 2024-02-12 NOTE — Addendum Note (Signed)
 Addendum  created 02/12/24 1701 by Vicci Camellia Glatter, MD   Attestation recorded in [facility], Intraprocedure Attestations filed

## 2024-07-01 ENCOUNTER — Ambulatory Visit: Admitting: Nurse Practitioner
